# Patient Record
Sex: Female | Born: 1965
Health system: Southern US, Community
[De-identification: ages and names within clinical notes are randomized; demographics above are authoritative.]

## PROBLEM LIST (undated history)

## (undated) DIAGNOSIS — T7840XA Allergy, unspecified, initial encounter: Secondary | ICD-10-CM

## (undated) DIAGNOSIS — R7303 Prediabetes: Secondary | ICD-10-CM

## (undated) DIAGNOSIS — E559 Vitamin D deficiency, unspecified: Secondary | ICD-10-CM

## (undated) DIAGNOSIS — K219 Gastro-esophageal reflux disease without esophagitis: Secondary | ICD-10-CM

## (undated) HISTORY — DX: Allergy, unspecified, initial encounter: T78.40XA

## (undated) HISTORY — DX: Prediabetes: R73.03

## (undated) HISTORY — DX: Vitamin D deficiency, unspecified: E55.9

## (undated) HISTORY — DX: Gastro-esophageal reflux disease without esophagitis: K21.9

---

## 1998-08-04 ENCOUNTER — Other Ambulatory Visit: Admission: RE | Admit: 1998-08-04 | Discharge: 1998-08-04 | Payer: Self-pay | Admitting: Obstetrics and Gynecology

## 2000-03-17 ENCOUNTER — Emergency Department (HOSPITAL_COMMUNITY): Admission: EM | Admit: 2000-03-17 | Discharge: 2000-03-17 | Payer: Self-pay | Admitting: Emergency Medicine

## 2000-03-17 ENCOUNTER — Encounter: Payer: Self-pay | Admitting: Emergency Medicine

## 2000-05-11 ENCOUNTER — Encounter (INDEPENDENT_AMBULATORY_CARE_PROVIDER_SITE_OTHER): Payer: Self-pay

## 2000-05-11 ENCOUNTER — Ambulatory Visit (HOSPITAL_COMMUNITY): Admission: RE | Admit: 2000-05-11 | Discharge: 2000-05-11 | Payer: Self-pay | Admitting: Gynecology

## 2001-05-15 ENCOUNTER — Other Ambulatory Visit: Admission: RE | Admit: 2001-05-15 | Discharge: 2001-05-15 | Payer: Self-pay | Admitting: Gynecology

## 2001-10-20 ENCOUNTER — Other Ambulatory Visit: Admission: RE | Admit: 2001-10-20 | Discharge: 2001-10-20 | Payer: Self-pay | Admitting: Gynecology

## 2002-05-07 ENCOUNTER — Inpatient Hospital Stay (HOSPITAL_COMMUNITY): Admission: AD | Admit: 2002-05-07 | Discharge: 2002-05-09 | Payer: Self-pay | Admitting: Gynecology

## 2002-06-19 ENCOUNTER — Other Ambulatory Visit: Admission: RE | Admit: 2002-06-19 | Discharge: 2002-06-19 | Payer: Self-pay | Admitting: Gynecology

## 2004-10-22 ENCOUNTER — Other Ambulatory Visit: Admission: RE | Admit: 2004-10-22 | Discharge: 2004-10-22 | Payer: Self-pay | Admitting: Gynecology

## 2005-02-01 ENCOUNTER — Encounter: Admission: RE | Admit: 2005-02-01 | Discharge: 2005-02-01 | Payer: Self-pay | Admitting: Gynecology

## 2005-04-22 ENCOUNTER — Inpatient Hospital Stay (HOSPITAL_COMMUNITY): Admission: RE | Admit: 2005-04-22 | Discharge: 2005-04-25 | Payer: Self-pay | Admitting: Gynecology

## 2005-04-22 ENCOUNTER — Encounter (INDEPENDENT_AMBULATORY_CARE_PROVIDER_SITE_OTHER): Payer: Self-pay | Admitting: *Deleted

## 2005-06-03 ENCOUNTER — Other Ambulatory Visit: Admission: RE | Admit: 2005-06-03 | Discharge: 2005-06-03 | Payer: Self-pay | Admitting: Gynecology

## 2006-07-22 ENCOUNTER — Other Ambulatory Visit: Admission: RE | Admit: 2006-07-22 | Discharge: 2006-07-22 | Payer: Self-pay | Admitting: Gynecology

## 2007-07-26 ENCOUNTER — Other Ambulatory Visit: Admission: RE | Admit: 2007-07-26 | Discharge: 2007-07-26 | Payer: Self-pay | Admitting: Gynecology

## 2008-08-15 ENCOUNTER — Other Ambulatory Visit: Admission: RE | Admit: 2008-08-15 | Discharge: 2008-08-15 | Payer: Self-pay | Admitting: Gynecology

## 2008-08-15 ENCOUNTER — Ambulatory Visit: Payer: Self-pay | Admitting: Gynecology

## 2008-08-15 ENCOUNTER — Encounter: Payer: Self-pay | Admitting: Gynecology

## 2009-01-27 ENCOUNTER — Ambulatory Visit: Payer: Self-pay | Admitting: Internal Medicine

## 2009-10-08 ENCOUNTER — Ambulatory Visit: Payer: Self-pay | Admitting: Gynecology

## 2009-10-08 ENCOUNTER — Other Ambulatory Visit: Admission: RE | Admit: 2009-10-08 | Discharge: 2009-10-08 | Payer: Self-pay | Admitting: Gynecology

## 2009-10-13 LAB — HM PAP SMEAR: HM Pap smear: NORMAL

## 2010-07-03 NOTE — H&P (Signed)
Emily Lara, SEIVERT                         ACCOUNT NO.:  [id number]   MEDICAL RECORD NO.:  [id number]                   PATIENT TYPE:  INP   LOCATION:  9165                                 FACILITY:  [facility]   PHYSICIAN:  Juan H. Lily Peer, M.D.             DATE OF BIRTH:  01/10/1966   DATE OF ADMISSION:  05/07/2002  DATE OF DISCHARGE:                                HISTORY & PHYSICAL   CHIEF COMPLAINT:  1. Contraction.  2. Spontaneous rupture of membranes.   HISTORY:  The patient is a 45 year old gravida 2, para 0, AB-1 with an  estimated date of confinement of 05/23/02, currently 37-1/2 weeks estimated  gestational age presenting to [facility] complaining of rupture of  membranes since approximately 0930 Hr.  She showed up at the hospital later  on in the morning and was found to be contracting spontaneously every three  to five minutes apart.  There was evidence of gross rupture of membranes,  positive nitrazine, positive ferning, and patient's GBS status is negative  and she was admitted to labor and delivery.   PAST MEDICAL HISTORY:  Diet controlled gestational diabetic.  She had  declined genetic amniocentesis.  She did have a normal maternal serum alpha  fetoprotein test.  The third trimester she started having evidence of TIA.  Labs were drawn.  Her blood pressure had resolved, but her SGOT and SGPT  were slightly elevated.  The patient had repeat liver function tests which  were elevated but she had just gotten over a recent gastroenteritis.  Nevertheless a hepatitis panel had been obtained which was negative.   PAST MEDICAL HISTORY:  The patient denies any allergies.  She had a  spontaneous AB in '02.  Limited ankle swelling.  She has a negative past  medical history.  Her ultrasound and dates have coincided.   REVIEW OF SYSTEMS:  See Hollister form.   PHYSICAL EXAMINATION:  VITAL SIGNS: Stable, afebrile.  HEENT: Unremarkable.  NECK: Supple, trachea midline. No  carotid bruits.  No thyromegaly.  LUNGS: Clear to auscultation without rhonchi or wheezes.  HEART: Regular rate and rhythm, no murmurs or gallops.  BREAST EXAM: Not done.  ABDOMEN: Gravid uterus, vertex presentation, bilobe, ________ positive fetal  heart tones.  PELVIC EXAM: Cervix is one to two cm, 80% effaced, -3 station with gross  rupture of membranes.  LOWER EXTREMITIES:  Deep tendon reflexes 1+.  Negative clonus. Trace edema.   PRENATAL LABS:  Blood type O positive.  Negative antibody screen. VDRL was  nonreactive.  Rubella immune.  Hepatitis B surface antigen and HIV were  negative.  Alpha fetoprotein was normal.  Abnormal diabetes screen.  Pap  smear was normal.  GBS culture was negative.   ASSESSMENT:  45 year old gravida 2, para 0 at 37-1/2 weeks estimated  gestational age with gross rupture of membranes at approximately 0930 hour  in early labor.  Will augment  with Pitocin and at 2100 hour if she has not  delivered will start Pen-G protocol GBS prophylaxis.  Tracing reassuring on  admission.  Hopefully we can spare a vacuum delivery.   PLAN:  As per assessment above.                                               Juan H. Lily Peer, M.D.    JHF/MEDQ  D:  05/07/2002  T:  05/08/2002  Job:  [id number]

## 2010-07-03 NOTE — Discharge Summary (Signed)
NAMEJERILYNN, Emily Lara               ACCOUNT NO.:  [id number]   MEDICAL RECORD NO.:  [id number]          PATIENT TYPE:  INP   LOCATION:  9139                          [facility]ILITY[facility]  [facility]   PHYSICIAN:  Ivor Costa. Farrel Gobble, M.D. DATE OF BIRTH:  05-27-1965   DATE OF ADMISSION:  04/22/2005  DATE OF DISCHARGE:  04/25/2005                                 DISCHARGE SUMMARY   PRINCIPAL DIAGNOSIS:  Placenta previa.   ADDITIONAL DIAGNOSES:  1.  Gestational diabetes.  2.  term pregnancy.  3.  Transverse lie.   PRINCIPAL PROCEDURE:  Primary cesarean section.   HOSPITAL COURSE:  The patient was admitted in the morning of April 22, 2005,  and underwent an elective primary cesarean section, low transverse under  spinal anesthesia for the above indications with delivery of a viable female.  Birth weight was 5 pounds 8.  Apgars were 6 and 9, otherwise normal pelvis.  The patient was transferred to the postpartum floor in due fashion.  Her  postpartum course was unremarkable.  By postpartum day #3, the patient was  ready for discharge.  She was tolerating regular diet, ambulating without  difficulty, and bottle feeding her infant.  The patient was therefore  discharged home in stable condition with a prescription for Tylox 1 q.6 h  which we recommend she combine with over-the-counter Motrin 600 mg.  She was  instructed to follow up in our office in 6 weeks.  There was question with  regard to circumcision of the newborn which, we will, if the patient decides  to proceed with it, which will be done postdischarge status.  All questions  were addressed.  Her postoperative exam was unremarkable.  Her abdomen was  soft, nontender.  Incision was clean, dry, intact with Steri-Strips.  Her  extremities were negative.      Ivor Costa. Farrel Gobble, M.D.  Electronically Signed     THL/MEDQ  D:  04/25/2005  T:  04/26/2005  Job:  [id number]

## 2010-07-03 NOTE — H&P (Signed)
Emily Lara, Emily Lara               ACCOUNT NO.:  [id number]   MEDICAL RECORD NO.:  [id number]          PATIENT TYPE:  INP   LOCATION:  NA                            [facility]   PHYSICIAN:  Timothy P. Fontaine, M.D.DATE OF BIRTH:  1965-03-23   DATE OF ADMISSION:  DATE OF DISCHARGE:                                HISTORY & PHYSICAL   DATE OF ADMISSION:  April 22, 2005, [facility], 9 o'clock surgery.   CHIEF COMPLAINT:  Pregnancy at 37 weeks, placenta previa, gestational  diabetes, PUPPS.   HISTORY OF PRESENT ILLNESS:  A 45 year old G32, P52 female, [redacted] weeks  gestation, who has been followed for gestational diabetes with good glucose  control without medication, normal antepartum testing. The patient has  persistent posterior placenta previa, has been counseled as to the situation  and is admitted for cesarean section. The patient also has developed a rash  in the last 2 weeks consistent with PUPPS. For the remainder of her history,  see her Hollister.   EXAMINATION:  HEENT:  Normal.  LUNGS:  Clear.  CARDIAC:  Regular rate without rubs, murmurs or gallops.  ABDOMEN:  Gravid, vertex fetus, consistent with dates.  PELVIC:  Deferred.  SKIN:  With a papular diffuse truncal extremity rash consistent with PUPPS.   ASSESSMENT AND PLAN:  A 45 year old gravida 3, para 1 female, [redacted] weeks  gestation, persistent posterior placenta previa for cesarean section. The  patient is also followed for gestational diabetes, diet controlled, with  normal NST and AFI, weekly ultrasounds. The fetus has been known to be in  the 5th to 18th percentile as far as growth. She has had no episodes of  bleeding throughout this pregnancy. I reviewed the situation and the issues  of timing of her cesarean with or without amniocentesis for fetal lung  maturity. At [redacted] weeks gestation, I feel most appropriate to proceed with  direct delivery, recognizing possibilities for preterm issues such as  glucose  intolerance, heat stability, respiratory issues - all discussed with  the patient and she is comfortable with proceeding with the procedure. I  also discussed specifically the risks of a placenta previa for hemorrhage  and the realistic risk of transfusion during the procedure or immediately  following the procedure, and she understands and accepts these risks of  transfusion. She also understands a realistic risk of cesarean hysterectomy  if uncontrolled hemorrhage occurs either intraoperatively or  postoperatively, ultimately requiring hysterectomy and she understands and  accepts this possibility. The standard risks of cesarean section were  reviewed with her to include risk of infection both internal requiring  prolonged antibiotics, abscess formation requiring reoperation and abscess  drainage, as well as wound complications requiring opening and draining of  incisions and closure by secondary intention. The risk of inadvertent injury  to internal organs including bowel, bladder, ureters, vessels and nerves  necessitating major  exploratory reparative surgeries and future reparative surgeries including  ostomy formation was all discussed, understood, accepted. The patient's  questions were answered to her satisfaction and she is ready to proceed with  surgery.  Timothy P. Fontaine, M.D.  Electronically Signed     TPF/MEDQ  D:  04/16/2005  T:  04/16/2005  Job:  [id number]

## 2010-07-03 NOTE — Op Note (Signed)
[facility]  Patient:    Emily Lara, Emily Lara                      MRN: [id number] Proc. Date: 05/11/00 Adm. Date:  [id number] Attending:  Merrily Pew                           Operative Report  PREOPERATIVE DIAGNOSIS:       Missed abortion, approximately six weeks.  POSTOPERATIVE DIAGNOSIS:      Missed abortion, approximately six weeks.  OPERATION:                    Suction dilatation and curettage.  SURGEON:                      Timothy P. Fontaine, M.D.  ANESTHESIA:                   1% lidocaine paracervical block with IV sedation.  ESTIMATED BLOOD LOSS:         Minimal  INDICATIONS:                  None.  SPECIMEN:                     Products of conception.  FINDINGS:                     Uterus approximately 8-week size, soft.  Adnexa without gross masses.  Gross POC retrieved; #7 suction curet used.  DESCRIPTION OF PROCEDURE:     The patient was taken to the operating room and received IV sedation.  She was placed in the low dorsolithotomy position and was draped in the usual fashion.  Examination under anesthesia was performed, and subsequently the vagina was cleansed using Betadine solution.  A paracervical block using 1% lidocaine was placed approximately 10 cc total, and the anterior lip of the cervix was grasped with a single-tooth tenaculum for stabilization.  The cervix was then gently dilated to admit the #7 suction curet, and the suction curettage was then performed without difficulty.  The gross POC was retrieved.  A gentle sharp curettage was then performed to assure complete emptying without difficulty.  There was no evidence of perforation throughout the entire case.  The instruments were then removed. Adequate hemostasis was visualized.  The speculum was removed, the patient placed in the supine position and taken to the recovery room in good condition having tolerated the procedure well. DD:  05/11/00 TD:   05/12/00 Job: [id number]

## 2010-07-03 NOTE — H&P (Signed)
[facility] [place]  Patient:    Emily Lara, Emily Lara                        MRN: Adm. Date:  05/11/00 Attending:  Marcial Pacas P. Fontaine, M.D.                         History and Physical  She is being admitted for day surgery on May 11, 2000, at approximately 2 p.m.  CHIEF COMPLAINT:              Missed abortion.  HISTORY OF PRESENT ILLNESS:   A 45 year old, G1, P108 female, last menstrual period March 01, 2000, initially evaluated February 25 complaining of lower abdominal discomfort and back pain.  She was found at that time on ultrasound to have a less than 5-week size gestational sac, no fetal pole or yolk sac noted.  The patient underwent repeat ultrasound approximately twoweeks later, and she was found to have a gestational sac consistent with6 weeks and 4 days, although no fetal pole or viability was demonstrated.  The patient was followed, and repeat ultrasound nine days later showedno change in the gestational sac consistent with a missed AB.  The patient was counselled at that time for expectant versus D&C management, and the patient was reluctant to proceed with D&C management and elected for repeat ultrasound.  She again underwent ultrasound one week later and, again, findings were unchanged consistent with a missed AB, and she is admitted at this time for suction D&C. The patients blood type on outpatient evaluation was 0 positive.  PAST MEDICAL HISTORY:         Uncomplicated.  PAST SURGICAL HISTORY:        Uncomplicated.  ALLERGIES:                  None.  CURRENT MEDICATIONS:          Vitamins.  REVIEW OF SYSTEMS:            Noncontributory.  FAMILY HISTORY:            Noncontributory.  SOCIAL HISTORY:               Noncontributory.  PHYSICAL EXAMINATION:  VITAL SIGNS:        Afebrile.  Vital signs are stable.  GENERAL:    Exam per anesthesia intake.  PELVIC:                       External BUS, vagina normal.  Cervix  normal. Uterus retroverted, grossly normal in size.  Adnexa without masses or tenderness.  ASSESSMENT:              A 45 year old gravida 1, para 1 female with missed abortion by serial ultrasonographic evaluation followed expectantly with no bleeding for suction dilatation and curettage.  The patients blood type is O positive.  The risks, benefits, indications, and alternatives were reviewed with her, and the risks of bleeding, transfusion, infection, uterine perforation, damage to internal organs necessitating major exploratory repairative surgery all discussed, understood, and accepted.  The patients questions were answered to her satisfaction, and she is ready to proceed with surgery.DD:  05/10/00 TD:  05/10/00 Job: [id number]

## 2010-07-03 NOTE — Op Note (Signed)
NAMEWILHEMINA, Emily Lara               ACCOUNT NO.:  [id number]   MEDICAL RECORD NO.:  [id number]          PATIENT TYPE:  INP   LOCATION:  9199                          [facility]ILITY:  [facility]   PHYSICIAN:  Timothy P. Fontaine, M.D.DATE OF BIRTH:  Aug 07, 1965   DATE OF PROCEDURE:  04/22/2005  DATE OF DISCHARGE:                                 OPERATIVE REPORT   PREOPERATIVE DIAGNOSES:  Pregnancy at 37 weeks, placenta previa, gestational  diabetes, diet-controlled, PUPPS.   POSTOPERATIVE DIAGNOSES:  Pregnancy at 37 weeks, placenta previa,  gestational diabetes, diet-controlled, PUPPS.  With additional transverse  lie.   PROCEDURE:  Primary low transverse cervical cesarean section.   SURGEON:  Dr. Audie Box   ASSISTANT:  Dr. Lily Peer.   ANESTHETIC:  Regional.   SPECIMEN:  Placenta. Samples of cord blood.   ESTIMATED BLOOD LOSS:  Less than 500 mL.   COMPLICATIONS:  None.   FINDINGS:  At 73 normal female, Apgars 6 and 9, weight 5 pounds 8 ounces.  Pelvic anatomy noted to be normal. Infant found in the transverse  presentation head to the left. Pelvic anatomy was noted to be normal.   PROCEDURE:  The patient was taken to the operating room, underwent spinal  anesthesia and was placed in left tilt supine position, received abdominal  preparation with Betadine solution. Bladder emptied with indwelling Foley  catheterization placed in sterile technique and the patient was draped in  usual fashion. All per nursing personnel. After assuring adequate anesthesia  the abdomen sharply entered through a Pfannenstiel's incision achieving  adequate hemostasis at all levels. The bladder flap was sharply bluntly  developed without difficulty. Uterus sharply entered the lower uterine  segment and bluntly extended laterally. The bulging membranes were ruptured.  Infant found to be in the transverse presentation, converted to a footling  breech and underwent a breech extraction without difficulty. The  nares and  mouth were suctioned. The cord doubly clamped and cut. The infant was handed  to pediatrics in attendance. Samples of cord blood were obtained. Placenta  spontaneously extruded and noted to be intact and was sent to pathology,  noted to be a posterior previa. The uterus was exteriorized, endometrial  cavity explored with the sponge to remove all placental and membrane  fragments and the uterine incision was then closed in two layers using 0  Vicryl suture in a running interlocking stitch followed by imbricating  stitch. Several figure-of-eight interrupted sutures were placed to achieve  ultimate hemostasis along the uterine incision line. The patient received 1  gram Ancef antibiotic prophylaxis at this time. Uterus was returned to the  abdomen which was copiously irrigated. Adequate hemostasis visualized and  anterior fascia was reapproximated using 0 Vicryl suture in running stitch  starting at the angle, meeting in the middle. Subcutaneous tissues were  irrigated. Adequate hemostasis  achieved with electrocautery. Skin reapproximated using 4-0 Vicryl running  subcuticular stitch. Steri-Strips, Benzoin applied. Sterile dressing  applied. The patient was taken to recovery room in good condition having  tolerated procedure well.      Timothy P. Fontaine, M.D.  Electronically Signed  TPF/MEDQ  D:  04/22/2005  T:  04/22/2005  Job:  [id number]

## 2010-07-03 NOTE — Discharge Summary (Signed)
   [name], Emily Lara                         ACCOUNT NO.:  [id number]   MEDICAL RECORD NO.:  [id number]                   PATIENT TYPE:  INP   LOCATION:  9112                                 [facility]   PHYSICIAN:  Timothy P. Fontaine, M.D.           DATE OF BIRTH:  1965/06/20   DATE OF ADMISSION:  05/07/2002  DATE OF DISCHARGE:  05/09/2002                                 DISCHARGE SUMMARY   DISCHARGE DIAGNOSES:  1. Intrauterine pregnancy 37 weeks, delivered.  2. Gestational diabetes, diet controlled.  3. History of increased liver function tests.  4. Status post vacuum assisted vaginal delivery with midline episiotomy and     fourth degree laceration, repaired.   HISTORY:  This is a 45 year old female gravida 2, para 0, aborta 1 with an  EDC of May 23, 2002.  Prenatal course complicated by gestational diabetes  which was diet controlled.  She declined genetic amniocentesis.  She was  followed because she did have some increasing blood pressures.  PIH  laboratory was drawn.  Blood pressure returned back to normal but SGOT and  SGPT were slightly elevated.  The patient had repeat liver function tests  which again were elevated but she had just gotten over a recent  gastroenteritis.  Hepatitis panel returned, which was negative.   HOSPITAL COURSE:  On May 07, 2002 patient admitted at 37 weeks in labor.  Labor was augmented with Pitocin.  She was begun on IV _________ bilaterally  and on May 08, 2002 patient subsequently underwent a vacuum assisted  vaginal delivery secondary to poor maternal pushing coordination and  underwent delivery of a female, Apgars of 9 and 9, weight of 5 pounds 13  ounces.  There was a midline episiotomy with fourth degree laceration which  was repaired.  Postpartum initially had to be I&O catheterized, but  subsequently was able to void spontaneously.  On May 09, 2002 her SGOT was  slightly up at 40.  SGPT, however, returned back to normal.   The patient by  March _____ was voiding, stable condition without problems, and was felt  stable for discharge.   ACCESSORY CLINICAL FINDINGS:  Laboratories:  The patient is O+.  Rubella  immune.  On May 08, 2002 hemoglobin 11.1.  Glucose 133.  SGOT 40, SGPT 34.   DISPOSITION:  The patient was discharged to home.  Return to the office in  six weeks.  If any problem prior to that time to be seen in the office.  Given Tylox p.r.n. pain.  Was to have a follow-up LFTs at her follow-up  visit.     Susa Loffler, P.A.                    Timothy P. Audie Box, M.D.    TSG/MEDQ  D:  06/08/2002  T:  06/08/2002  Job:  [id number]

## 2011-01-06 ENCOUNTER — Encounter: Payer: Self-pay | Admitting: Gynecology

## 2011-01-06 ENCOUNTER — Ambulatory Visit (INDEPENDENT_AMBULATORY_CARE_PROVIDER_SITE_OTHER): Payer: BC Managed Care – PPO | Admitting: Gynecology

## 2011-01-06 VITALS — BP 120/70 | Ht 60.0 in | Wt 142.0 lb

## 2011-01-06 DIAGNOSIS — Z13220 Encounter for screening for lipoid disorders: Secondary | ICD-10-CM

## 2011-01-06 DIAGNOSIS — R635 Abnormal weight gain: Secondary | ICD-10-CM

## 2011-01-06 DIAGNOSIS — Z01419 Encounter for gynecological examination (general) (routine) without abnormal findings: Secondary | ICD-10-CM

## 2011-01-06 DIAGNOSIS — Z23 Encounter for immunization: Secondary | ICD-10-CM

## 2011-01-06 DIAGNOSIS — Z131 Encounter for screening for diabetes mellitus: Secondary | ICD-10-CM

## 2011-01-06 DIAGNOSIS — R82998 Other abnormal findings in urine: Secondary | ICD-10-CM

## 2011-01-06 NOTE — Progress Notes (Signed)
Emily Lara 1965-06-13 [id number]        45 y.o.  for annual exam.  Doing well other than complaining of some weight gain.  Past medical history,surgical history, medications, allergies, family history and social history were all reviewed and documented in the EPIC chart. ROS:  Was performed and pertinent positives and negatives are included in the history.  Exam: chaperone present Filed Vitals:   01/06/11 1540  BP: 120/70   General appearance  Normal Skin grossly normal Head/Neck normal with no cervical or supraclavicular adenopathy thyroid normal Lungs  clear Cardiac RR, without RMG Abdominal  soft, nontender, without masses, organomegaly or hernia Breasts  examined lying and sitting without masses, retractions, discharge or axillary adenopathy. Pelvic  Ext/BUS/vagina  normal   Cervix  normal    Uterus  retroverted, normal size, shape and contour, midline and mobile nontender   Adnexa  Without masses or tenderness    Anus and perineum  normal   Rectovaginal  normal sphincter tone without palpated masses or tenderness.    Assessment/Plan:  45 y.o. female for annual exam.    1. Weight gain. Patient states she's exercising and trying to eat appropriately but still is gaining weight. I will check a baseline TSH. I discussed guidelines as far as diet and exercise.  2. Birth control. Patient has stopped the birth control pills because she felt that they were contributing to her weight gain. She is using condoms and rhythm method and is comfortable with this. I reviewed the failure risk with this and the availability of Plan B. She does not want to do anything else. 3. Breast health. SBE monthly reviewed. She was due to have a repeat ultrasound in September and never followed up for this. She's going to call [facility] to follow up with them in reference to this and I stressed the importance of doing so. 4. Pap smear. Patient has no history of abnormal Pap smears before and has numerous  normal reports in her chart. Last normal was 2011. I discussed current screening guidelines and will plan on every 3 year Pap smear and she is comfortable with this. I did not do a Pap smear today. 5. Health maintenance. Baseline CBC urinalysis glucose lipid profile was ordered along with her TSH. Assuming she continues well from a gynecologic standpoint she will see me in a year sooner as needed.    Dara Lords MD, 4:01 PM 01/06/2011

## 2011-01-08 LAB — URINE CULTURE: Colony Count: 6000

## 2011-01-12 ENCOUNTER — Encounter: Payer: Self-pay | Admitting: Gynecology

## 2011-01-14 ENCOUNTER — Telehealth: Payer: Self-pay | Admitting: *Deleted

## 2011-01-14 NOTE — Telephone Encounter (Signed)
Lm for pt to call regarding [facility] note faxed to office for breast evaluation.

## 2011-01-28 ENCOUNTER — Encounter (INDEPENDENT_AMBULATORY_CARE_PROVIDER_SITE_OTHER): Payer: BC Managed Care – PPO | Admitting: Family Medicine

## 2011-01-28 DIAGNOSIS — E559 Vitamin D deficiency, unspecified: Secondary | ICD-10-CM

## 2011-01-28 DIAGNOSIS — Z13220 Encounter for screening for lipoid disorders: Secondary | ICD-10-CM

## 2011-01-28 DIAGNOSIS — Z0000 Encounter for general adult medical examination without abnormal findings: Secondary | ICD-10-CM

## 2011-06-09 ENCOUNTER — Encounter: Payer: Self-pay | Admitting: Gynecology

## 2011-06-10 ENCOUNTER — Encounter: Payer: Self-pay | Admitting: Gynecology

## 2012-01-12 ENCOUNTER — Encounter: Payer: BC Managed Care – PPO | Admitting: Gynecology

## 2012-06-05 ENCOUNTER — Encounter: Payer: Self-pay | Admitting: Gynecology

## 2012-06-08 LAB — HM MAMMOGRAPHY: HM Mammogram: NORMAL

## 2012-07-13 ENCOUNTER — Ambulatory Visit (INDEPENDENT_AMBULATORY_CARE_PROVIDER_SITE_OTHER): Payer: BC Managed Care – PPO | Admitting: Internal Medicine

## 2012-07-13 ENCOUNTER — Other Ambulatory Visit (INDEPENDENT_AMBULATORY_CARE_PROVIDER_SITE_OTHER): Payer: BC Managed Care – PPO

## 2012-07-13 ENCOUNTER — Encounter: Payer: Self-pay | Admitting: Internal Medicine

## 2012-07-13 VITALS — BP 120/78 | HR 70 | Temp 97.8°F | Resp 16 | Ht 60.0 in | Wt 136.8 lb

## 2012-07-13 DIAGNOSIS — Z0000 Encounter for general adult medical examination without abnormal findings: Secondary | ICD-10-CM

## 2012-07-13 LAB — CBC WITH DIFFERENTIAL/PLATELET
Eosinophils Relative: 4.2 % (ref 0.0–5.0)
HCT: 37.4 % (ref 36.0–46.0)
Hemoglobin: 12.8 g/dL (ref 12.0–15.0)
Lymphs Abs: 2.8 10*3/uL (ref 0.7–4.0)
Monocytes Relative: 5.2 % (ref 3.0–12.0)
Neutro Abs: 4 10*3/uL (ref 1.4–7.7)
Platelets: 263 10*3/uL (ref 150.0–400.0)
RBC: 4.14 Mil/uL (ref 3.87–5.11)
WBC: 7.6 10*3/uL (ref 4.5–10.5)

## 2012-07-13 LAB — URINALYSIS, ROUTINE W REFLEX MICROSCOPIC
Bilirubin Urine: NEGATIVE
Nitrite: NEGATIVE
pH: 6 (ref 5.0–8.0)

## 2012-07-13 LAB — COMPREHENSIVE METABOLIC PANEL
Albumin: 4 g/dL (ref 3.5–5.2)
BUN: 14 mg/dL (ref 6–23)
Calcium: 8.6 mg/dL (ref 8.4–10.5)
Chloride: 106 mEq/L (ref 96–112)
GFR: 142.09 mL/min (ref >60.00)
Glucose, Bld: 81 mg/dL (ref 70–99)
Potassium: 3.7 mEq/L (ref 3.5–5.1)

## 2012-07-13 LAB — LIPID PANEL
HDL: 53.7 mg/dL (ref >39.00)
Total CHOL/HDL Ratio: 3

## 2012-07-13 NOTE — Progress Notes (Signed)
  Subjective:    Patient ID: Emily Lara, female    DOB: 08/06/1965, 47 y.o.   MRN: [id number]  HPI  New to me for a physical, she feels well and offers no complaints.  Review of Systems  All other systems reviewed and are negative.       Objective:   Physical Exam  Vitals reviewed. Constitutional: She is oriented to person, place, and time. She appears well-developed and well-nourished. No distress.  HENT:  Head: Normocephalic and atraumatic.  Mouth/Throat: Oropharynx is clear and moist. No oropharyngeal exudate.  Eyes: Conjunctivae are normal. Right eye exhibits no discharge. Left eye exhibits no discharge. No scleral icterus.  Neck: Normal range of motion. Neck supple. No JVD present. No tracheal deviation present. No thyromegaly present.  Cardiovascular: Normal rate, regular rhythm, normal heart sounds and intact distal pulses.  Exam reveals no gallop and no friction rub.   No murmur heard. Pulmonary/Chest: Effort normal and breath sounds normal. No stridor. No respiratory distress. She has no wheezes. She has no rales. She exhibits no tenderness.  Abdominal: Soft. Bowel sounds are normal. She exhibits no distension and no mass. There is no tenderness. There is no rebound and no guarding.  Musculoskeletal: Normal range of motion. She exhibits no edema and no tenderness.  Lymphadenopathy:    She has no cervical adenopathy.  Neurological: She is oriented to person, place, and time.  Skin: Skin is warm and dry. No rash noted. She is not diaphoretic. No erythema. No pallor.  Psychiatric: She has a normal mood and affect. Her behavior is normal. Judgment and thought content normal.     No results found for this basename: WBC, HGB, HCT, PLT, GLUCOSE, CHOL, TRIG, HDL, LDLDIRECT, LDLCALC, ALT, AST, NA, K, CL, CREATININE, BUN, CO2, TSH, PSA, INR, GLUF, HGBA1C, MICROALBUR       Assessment & Plan:

## 2012-07-13 NOTE — Assessment & Plan Note (Signed)
Exam done Vaccines were reviewed Labs ordered Pt ed material was given 

## 2012-07-13 NOTE — Patient Instructions (Signed)
Preventive Care for Adults, Female A healthy lifestyle and preventive care can promote health and wellness. Preventive health guidelines for women include the following key practices.  A routine yearly physical is a good way to check with your caregiver about your health and preventive screening. It is a chance to share any concerns and updates on your health, and to receive a thorough exam.  Visit your dentist for a routine exam and preventive care every 6 months. Brush your teeth twice a day and floss once a day. Good oral hygiene prevents tooth decay and gum disease.  The frequency of eye exams is based on your age, health, family medical history, use of contact lenses, and other factors. Follow your caregiver's recommendations for frequency of eye exams.  Eat a healthy diet. Foods like vegetables, fruits, whole grains, low-fat dairy products, and lean protein foods contain the nutrients you need without too many calories. Decrease your intake of foods high in solid fats, added sugars, and salt. Eat the right amount of calories for you.Get information about a proper diet from your caregiver, if necessary.  Regular physical exercise is one of the most important things you can do for your health. Most adults should get at least 150 minutes of moderate-intensity exercise (any activity that increases your heart rate and causes you to sweat) each week. In addition, most adults need muscle-strengthening exercises on 2 or more days a week.  Maintain a healthy weight. The body mass index (BMI) is a screening tool to identify possible weight problems. It provides an estimate of body fat based on height and weight. Your caregiver can help determine your BMI, and can help you achieve or maintain a healthy weight.For adults 20 years and older:  A BMI below 18.5 is considered underweight.  A BMI of 18.5 to 24.9 is normal.  A BMI of 25 to 29.9 is considered overweight.  A BMI of 30 and above is  considered obese.  Maintain normal blood lipids and cholesterol levels by exercising and minimizing your intake of saturated fat. Eat a balanced diet with plenty of fruit and vegetables. Blood tests for lipids and cholesterol should begin at age 20 and be repeated every 5 years. If your lipid or cholesterol levels are high, you are over 50, or you are at high risk for heart disease, you may need your cholesterol levels checked more frequently.Ongoing high lipid and cholesterol levels should be treated with medicines if diet and exercise are not effective.  If you smoke, find out from your caregiver how to quit. If you do not use tobacco, do not start.  If you are pregnant, do not drink alcohol. If you are breastfeeding, be very cautious about drinking alcohol. If you are not pregnant and choose to drink alcohol, do not exceed 1 drink per day. One drink is considered to be 12 ounces (355 mL) of beer, 5 ounces (148 mL) of wine, or 1.5 ounces (44 mL) of liquor.  Avoid use of street drugs. Do not share needles with anyone. Ask for help if you need support or instructions about stopping the use of drugs.  High blood pressure causes heart disease and increases the risk of stroke. Your blood pressure should be checked at least every 1 to 2 years. Ongoing high blood pressure should be treated with medicines if weight loss and exercise are not effective.  If you are 55 to 45 years old, ask your caregiver if you should take aspirin to prevent strokes.  Diabetes   screening involves taking a blood sample to check your fasting blood sugar level. This should be done once every 3 years, after age 45, if you are within normal weight and without risk factors for diabetes. Testing should be considered at a younger age or be carried out more frequently if you are overweight and have at least 1 risk factor for diabetes.  Breast cancer screening is essential preventive care for women. You should practice "breast  self-awareness." This means understanding the normal appearance and feel of your breasts and may include breast self-examination. Any changes detected, no matter how small, should be reported to a caregiver. Women in their 20s and 30s should have a clinical breast exam (CBE) by a caregiver as part of a regular health exam every 1 to 3 years. After age 40, women should have a CBE every year. Starting at age 40, women should consider having a mammography (breast X-ray test) every year. Women who have a family history of breast cancer should talk to their caregiver about genetic screening. Women at a high risk of breast cancer should talk to their caregivers about having magnetic resonance imaging (MRI) and a mammography every year.  The Pap test is a screening test for cervical cancer. A Pap test can show cell changes on the cervix that might become cervical cancer if left untreated. A Pap test is a procedure in which cells are obtained and examined from the lower end of the uterus (cervix).  Women should have a Pap test starting at age 21.  Between ages 21 and 29, Pap tests should be repeated every 2 years.  Beginning at age 30, you should have a Pap test every 3 years as long as the past 3 Pap tests have been normal.  Some women have medical problems that increase the chance of getting cervical cancer. Talk to your caregiver about these problems. It is especially important to talk to your caregiver if a new problem develops soon after your last Pap test. In these cases, your caregiver may recommend more frequent screening and Pap tests.  The above recommendations are the same for women who have or have not gotten the vaccine for human papillomavirus (HPV).  If you had a hysterectomy for a problem that was not cancer or a condition that could lead to cancer, then you no longer need Pap tests. Even if you no longer need a Pap test, a regular exam is a good idea to make sure no other problems are  starting.  If you are between ages 65 and 70, and you have had normal Pap tests going back 10 years, you no longer need Pap tests. Even if you no longer need a Pap test, a regular exam is a good idea to make sure no other problems are starting.  If you have had past treatment for cervical cancer or a condition that could lead to cancer, you need Pap tests and screening for cancer for at least 20 years after your treatment.  If Pap tests have been discontinued, risk factors (such as a new sexual partner) need to be reassessed to determine if screening should be resumed.  The HPV test is an additional test that may be used for cervical cancer screening. The HPV test looks for the virus that can cause the cell changes on the cervix. The cells collected during the Pap test can be tested for HPV. The HPV test could be used to screen women aged 30 years and older, and should   be used in women of any age who have unclear Pap test results. After the age of 30, women should have HPV testing at the same frequency as a Pap test.  Colorectal cancer can be detected and often prevented. Most routine colorectal cancer screening begins at the age of 50 and continues through age 75. However, your caregiver may recommend screening at an earlier age if you have risk factors for colon cancer. On a yearly basis, your caregiver may provide home test kits to check for hidden blood in the stool. Use of a small camera at the end of a tube, to directly examine the colon (sigmoidoscopy or colonoscopy), can detect the earliest forms of colorectal cancer. Talk to your caregiver about this at age 50, when routine screening begins. Direct examination of the colon should be repeated every 5 to 10 years through age 75, unless early forms of pre-cancerous polyps or small growths are found.  Hepatitis C blood testing is recommended for all people born from 1945 through 1965 and any individual with known risks for hepatitis C.  Practice  safe sex. Use condoms and avoid high-risk sexual practices to reduce the spread of sexually transmitted infections (STIs). STIs include gonorrhea, chlamydia, syphilis, trichomonas, herpes, HPV, and human immunodeficiency virus (HIV). Herpes, HIV, and HPV are viral illnesses that have no cure. They can result in disability, cancer, and death. Sexually active women aged 25 and younger should be checked for chlamydia. Older women with new or multiple partners should also be tested for chlamydia. Testing for other STIs is recommended if you are sexually active and at increased risk.  Osteoporosis is a disease in which the bones lose minerals and strength with aging. This can result in serious bone fractures. The risk of osteoporosis can be identified using a bone density scan. Women ages 65 and over and women at risk for fractures or osteoporosis should discuss screening with their caregivers. Ask your caregiver whether you should take a calcium supplement or vitamin D to reduce the rate of osteoporosis.  Menopause can be associated with physical symptoms and risks. Hormone replacement therapy is available to decrease symptoms and risks. You should talk to your caregiver about whether hormone replacement therapy is right for you.  Use sunscreen with sun protection factor (SPF) of 30 or more. Apply sunscreen liberally and repeatedly throughout the day. You should seek shade when your shadow is shorter than you. Protect yourself by wearing long sleeves, pants, a wide-brimmed hat, and sunglasses year round, whenever you are outdoors.  Once a month, do a whole body skin exam, using a mirror to look at the skin on your back. Notify your caregiver of new moles, moles that have irregular borders, moles that are larger than a pencil eraser, or moles that have changed in shape or color.  Stay current with required immunizations.  Influenza. You need a dose every fall (or winter). The composition of the flu vaccine  changes each year, so being vaccinated once is not enough.  Pneumococcal polysaccharide. You need 1 to 2 doses if you smoke cigarettes or if you have certain chronic medical conditions. You need 1 dose at age 65 (or older) if you have never been vaccinated.  Tetanus, diphtheria, pertussis (Tdap, Td). Get 1 dose of Tdap vaccine if you are younger than age 65, are over 65 and have contact with an infant, are a healthcare worker, are pregnant, or simply want to be protected from whooping cough. After that, you need a Td   booster dose every 10 years. Consult your caregiver if you have not had at least 3 tetanus and diphtheria-containing shots sometime in your life or have a deep or dirty wound.  HPV. You need this vaccine if you are a woman age 26 or younger. The vaccine is given in 3 doses over 6 months.  Measles, mumps, rubella (MMR). You need at least 1 dose of MMR if you were born in 1957 or later. You may also need a second dose.  Meningococcal. If you are age 19 to 21 and a first-year college student living in a residence hall, or have one of several medical conditions, you need to get vaccinated against meningococcal disease. You may also need additional booster doses.  Zoster (shingles). If you are age 60 or older, you should get this vaccine.  Varicella (chickenpox). If you have never had chickenpox or you were vaccinated but received only 1 dose, talk to your caregiver to find out if you need this vaccine.  Hepatitis A. You need this vaccine if you have a specific risk factor for hepatitis A virus infection or you simply wish to be protected from this disease. The vaccine is usually given as 2 doses, 6 to 18 months apart.  Hepatitis B. You need this vaccine if you have a specific risk factor for hepatitis B virus infection or you simply wish to be protected from this disease. The vaccine is given in 3 doses, usually over 6 months. Preventive Services / Frequency Ages 19 to 39  Blood  pressure check.** / Every 1 to 2 years.  Lipid and cholesterol check.** / Every 5 years beginning at age 20.  Clinical breast exam.** / Every 3 years for women in their 20s and 30s.  Pap test.** / Every 2 years from ages 21 through 29. Every 3 years starting at age 30 through age 65 or 70 with a history of 3 consecutive normal Pap tests.  HPV screening.** / Every 3 years from ages 30 through ages 65 to 70 with a history of 3 consecutive normal Pap tests.  Hepatitis C blood test.** / For any individual with known risks for hepatitis C.  Skin self-exam. / Monthly.  Influenza immunization.** / Every year.  Pneumococcal polysaccharide immunization.** / 1 to 2 doses if you smoke cigarettes or if you have certain chronic medical conditions.  Tetanus, diphtheria, pertussis (Tdap, Td) immunization. / A one-time dose of Tdap vaccine. After that, you need a Td booster dose every 10 years.  HPV immunization. / 3 doses over 6 months, if you are 26 and younger.  Measles, mumps, rubella (MMR) immunization. / You need at least 1 dose of MMR if you were born in 1957 or later. You may also need a second dose.  Meningococcal immunization. / 1 dose if you are age 19 to 21 and a first-year college student living in a residence hall, or have one of several medical conditions, you need to get vaccinated against meningococcal disease. You may also need additional booster doses.  Varicella immunization.** / Consult your caregiver.  Hepatitis A immunization.** / Consult your caregiver. 2 doses, 6 to 18 months apart.  Hepatitis B immunization.** / Consult your caregiver. 3 doses usually over 6 months. Ages 40 to 64  Blood pressure check.** / Every 1 to 2 years.  Lipid and cholesterol check.** / Every 5 years beginning at age 20.  Clinical breast exam.** / Every year after age 40.  Mammogram.** / Every year beginning at age 40   and continuing for as long as you are in good health. Consult with your  caregiver.  Pap test.** / Every 3 years starting at age 30 through age 65 or 70 with a history of 3 consecutive normal Pap tests.  HPV screening.** / Every 3 years from ages 30 through ages 65 to 70 with a history of 3 consecutive normal Pap tests.  Fecal occult blood test (FOBT) of stool. / Every year beginning at age 50 and continuing until age 75. You may not need to do this test if you get a colonoscopy every 10 years.  Flexible sigmoidoscopy or colonoscopy.** / Every 5 years for a flexible sigmoidoscopy or every 10 years for a colonoscopy beginning at age 50 and continuing until age 75.  Hepatitis C blood test.** / For all people born from 1945 through 1965 and any individual with known risks for hepatitis C.  Skin self-exam. / Monthly.  Influenza immunization.** / Every year.  Pneumococcal polysaccharide immunization.** / 1 to 2 doses if you smoke cigarettes or if you have certain chronic medical conditions.  Tetanus, diphtheria, pertussis (Tdap, Td) immunization.** / A one-time dose of Tdap vaccine. After that, you need a Td booster dose every 10 years.  Measles, mumps, rubella (MMR) immunization. / You need at least 1 dose of MMR if you were born in 1957 or later. You may also need a second dose.  Varicella immunization.** / Consult your caregiver.  Meningococcal immunization.** / Consult your caregiver.  Hepatitis A immunization.** / Consult your caregiver. 2 doses, 6 to 18 months apart.  Hepatitis B immunization.** / Consult your caregiver. 3 doses, usually over 6 months. Ages 65 and over  Blood pressure check.** / Every 1 to 2 years.  Lipid and cholesterol check.** / Every 5 years beginning at age 20.  Clinical breast exam.** / Every year after age 40.  Mammogram.** / Every year beginning at age 40 and continuing for as long as you are in good health. Consult with your caregiver.  Pap test.** / Every 3 years starting at age 30 through age 65 or 70 with a 3  consecutive normal Pap tests. Testing can be stopped between 65 and 70 with 3 consecutive normal Pap tests and no abnormal Pap or HPV tests in the past 10 years.  HPV screening.** / Every 3 years from ages 30 through ages 65 or 70 with a history of 3 consecutive normal Pap tests. Testing can be stopped between 65 and 70 with 3 consecutive normal Pap tests and no abnormal Pap or HPV tests in the past 10 years.  Fecal occult blood test (FOBT) of stool. / Every year beginning at age 50 and continuing until age 75. You may not need to do this test if you get a colonoscopy every 10 years.  Flexible sigmoidoscopy or colonoscopy.** / Every 5 years for a flexible sigmoidoscopy or every 10 years for a colonoscopy beginning at age 50 and continuing until age 75.  Hepatitis C blood test.** / For all people born from 1945 through 1965 and any individual with known risks for hepatitis C.  Osteoporosis screening.** / A one-time screening for women ages 65 and over and women at risk for fractures or osteoporosis.  Skin self-exam. / Monthly.  Influenza immunization.** / Every year.  Pneumococcal polysaccharide immunization.** / 1 dose at age 65 (or older) if you have never been vaccinated.  Tetanus, diphtheria, pertussis (Tdap, Td) immunization. / A one-time dose of Tdap vaccine if you are over   65 and have contact with an infant, are a healthcare worker, or simply want to be protected from whooping cough. After that, you need a Td booster dose every 10 years.  Varicella immunization.** / Consult your caregiver.  Meningococcal immunization.** / Consult your caregiver.  Hepatitis A immunization.** / Consult your caregiver. 2 doses, 6 to 18 months apart.  Hepatitis B immunization.** / Check with your caregiver. 3 doses, usually over 6 months. ** Family history and personal history of risk and conditions may change your caregiver's recommendations. Document Released: 03/30/2001 Document Revised: 04/26/2011  Document Reviewed: 06/29/2010 [employer] Patient Information 2014 [employer].  

## 2013-01-29 ENCOUNTER — Encounter: Payer: Self-pay | Admitting: Gynecology

## 2013-01-29 ENCOUNTER — Other Ambulatory Visit (HOSPITAL_COMMUNITY)
Admission: RE | Admit: 2013-01-29 | Discharge: 2013-01-29 | Disposition: A | Discharge status: Home / Self Care | Payer: BC Managed Care – PPO | Source: Ambulatory Visit | Attending: Gynecology | Admitting: Gynecology

## 2013-01-29 ENCOUNTER — Ambulatory Visit (INDEPENDENT_AMBULATORY_CARE_PROVIDER_SITE_OTHER): Payer: BC Managed Care – PPO | Admitting: Gynecology

## 2013-01-29 VITALS — BP 114/70 | Ht 60.0 in | Wt 137.6 lb

## 2013-01-29 DIAGNOSIS — Z01419 Encounter for gynecological examination (general) (routine) without abnormal findings: Secondary | ICD-10-CM | POA: Insufficient documentation

## 2013-01-29 DIAGNOSIS — Z1151 Encounter for screening for human papillomavirus (HPV): Secondary | ICD-10-CM | POA: Insufficient documentation

## 2013-01-29 DIAGNOSIS — Z309 Encounter for contraceptive management, unspecified: Secondary | ICD-10-CM

## 2013-01-29 DIAGNOSIS — N951 Menopausal and female climacteric states: Secondary | ICD-10-CM

## 2013-01-29 NOTE — Progress Notes (Signed)
Emily Lara [birth year] [id number]        47 y.o.  [id number] for annual exam.  Several issues below.  Past medical history,surgical history, problem list, medications, allergies, family history and social history were all reviewed and documented in the EPIC chart.  ROS:  Performed and pertinent positives and negatives are included in the history, assessment and plan .  Exam: Emily Lara assistant Filed Vitals:   01/29/13 1440  BP: 114/70  Height: 5' (1.524 m)  Weight: 137 lb 9.6 oz (62.415 kg)   General appearance  Normal Skin grossly normal Head/Neck normal with no cervical or supraclavicular adenopathy thyroid normal Lungs  clear Cardiac RR, without RMG Abdominal  soft, nontender, without masses, organomegaly or hernia Breasts  examined lying and sitting without masses, retractions, discharge or axillary adenopathy. Pelvic  Ext/BUS/vagina  Normal   Cervix  Normal with Pap/HPV done  Uterus  retroverted, normal size, shape and contour, midline and mobile nontender   Adnexa  Without masses or tenderness    Anus and perineum  Normal   Rectovaginal  Normal sphincter tone without palpated masses or tenderness.    Assessment/Plan:  47 y.o. [name] female for annual exam, regular menses, condom birth control.   1. Contraceptive management. I again reviewed contraceptive options. Does not want hormonal containing products such as pills. She feels it's related to her weight gain. Literature for Mirena IUD was provided. I reviewed mechanism of action and it does contain progesterone. She's happy with condoms for now. I reviewed the failure risk and again the availability of plan B. Patient's to followup for Mirena IUD if she chooses otherwise annually. 2. Hot flushes. Patient noted some hot flashes primarily at night. Having regular menses without other symptoms. We'll check baseline TSH FSH. If labs normal and symptoms continue she'll followup with her primary for further evaluation. 3. Pap  smear 2011. Pap/HPV done today. No history of abnormal Pap smears previously. 4. Mammography 05/2012. Continue with annual mammography. SBE monthly reviewed. 5. Health maintenance. No routine blood work done as she reports it is done through her primary physician's office. Followup if she decides for Mirena IUD otherwise annually   Note: This document was prepared with digital dictation and possible smart phrase technology. Any transcriptional errors that result from this process are unintentional.   Emily Lords MD, 2:59 PM 01/29/2013

## 2013-01-29 NOTE — Addendum Note (Signed)
Addended by: Richardson Chiquito on: 01/29/2013 03:42 PM   Modules accepted: Orders

## 2013-01-29 NOTE — Patient Instructions (Signed)
Followup for the hormone results. Followup for the Mirena IUD if you choose to arrange. Followup in one year for annual exam.

## 2013-01-30 ENCOUNTER — Encounter: Payer: Self-pay | Admitting: Gynecology

## 2013-01-30 LAB — TSH: TSH: 1.781 u[IU]/mL (ref 0.350–4.500)

## 2013-01-30 LAB — URINALYSIS W MICROSCOPIC + REFLEX CULTURE
Bilirubin Urine: NEGATIVE
Crystals: NONE SEEN
Specific Gravity, Urine: 1.015 (ref 1.005–1.030)
Squamous Epithelial / LPF: NONE SEEN
Urobilinogen, UA: 0.2 mg/dL (ref 0.0–1.0)

## 2013-01-31 ENCOUNTER — Other Ambulatory Visit: Payer: Self-pay | Admitting: Gynecology

## 2013-01-31 MED ORDER — SULFAMETHOXAZOLE-TMP DS 800-160 MG PO TABS: 1.0000 | ORAL_TABLET | Freq: Two times a day (BID) | ORAL | Status: DC

## 2013-06-02 ENCOUNTER — Ambulatory Visit (INDEPENDENT_AMBULATORY_CARE_PROVIDER_SITE_OTHER): Payer: BC Managed Care – PPO | Admitting: Family Medicine

## 2013-06-02 VITALS — BP 100/62 | HR 81 | Temp 97.7°F | Resp 18 | Ht 60.5 in | Wt 143.0 lb

## 2013-06-02 DIAGNOSIS — J209 Acute bronchitis, unspecified: Secondary | ICD-10-CM

## 2013-06-02 DIAGNOSIS — R05 Cough: Secondary | ICD-10-CM

## 2013-06-02 DIAGNOSIS — R059 Cough, unspecified: Secondary | ICD-10-CM

## 2013-06-02 DIAGNOSIS — J029 Acute pharyngitis, unspecified: Secondary | ICD-10-CM

## 2013-06-02 MED ORDER — AZITHROMYCIN 250 MG PO TABS: ORAL_TABLET | ORAL | Status: DC

## 2013-06-02 MED ORDER — PREDNISONE 20 MG PO TABS: ORAL_TABLET | ORAL | Status: DC

## 2013-06-02 NOTE — Patient Instructions (Signed)

## 2013-06-02 NOTE — Progress Notes (Signed)
° °  Subjective:   This chart was scribed for Robyn Haber, MD  by Forrestine Him, [facility] Scribe. This patient was seen in room 4 and the patient's care was started 10:01 AM.    Patient ID: Emily Lara, female    DOB: 1965-05-07, 48 y.o.   MRN: [id number]  Chief Complaint  Patient presents with   Cough    x3 days otc's no not helping   Sore Throat     HPI  HPI Comments: Emily Lara is a 48 y.o. female who presents to [facility] complaining of constant, moderate sore throat x 3 days that is progressively worsening. She also reports associated cough and wheezing. She has tried multiple OTC medications with no noticeable improvement for her symptoms. At this time she denies any congestion or fever. No pertinent medical history. No other concerns this visit.  Pt currently works at [employer]  Past Medical History  Diagnosis Date   Allergy     Review of Systems  Constitutional: Negative for fever and chills.  HENT: Positive for sore throat. Negative for congestion.   Eyes: Negative for redness.  Respiratory: Positive for cough and wheezing.   Skin: Negative for rash.  Psychiatric/Behavioral: Negative for confusion.       Objective:   Physical Exam  Nursing note and vitals reviewed. Constitutional: She is oriented to person, place, and time. She appears well-developed and well-nourished.  HENT:  Head: Normocephalic and atraumatic.  Eyes: EOM are normal.  Neck: Normal range of motion.  Cardiovascular: Normal rate, regular rhythm and normal heart sounds.   Pulmonary/Chest: Effort normal. She has wheezes.  Musculoskeletal: Normal range of motion.  Neurological: She is alert and oriented to person, place, and time.  Skin: Skin is warm and dry.  Psychiatric: She has a normal mood and affect. Her behavior is normal.    Assessment & Plan:  I personally performed the services described in this documentation, which was scribed in  my presence. The recorded information has been reviewed and is accurate. Cough - Plan: azithromycin (ZITHROMAX Z-PAK) 250 MG tablet, predniSONE (DELTASONE) 20 MG tablet  Signed, Robyn Haber, MD

## 2013-06-06 ENCOUNTER — Encounter: Payer: Self-pay | Admitting: Gynecology

## 2013-06-07 ENCOUNTER — Encounter: Payer: Self-pay | Admitting: Gynecology

## 2014-04-29 ENCOUNTER — Ambulatory Visit (INDEPENDENT_AMBULATORY_CARE_PROVIDER_SITE_OTHER): Payer: BLUE CROSS/BLUE SHIELD | Admitting: Family Medicine

## 2014-04-29 ENCOUNTER — Encounter: Payer: Self-pay | Admitting: Family Medicine

## 2014-04-29 VITALS — BP 118/76 | HR 70 | Temp 98.0°F | Resp 16 | Ht 59.5 in | Wt 139.2 lb

## 2014-04-29 DIAGNOSIS — Z131 Encounter for screening for diabetes mellitus: Secondary | ICD-10-CM

## 2014-04-29 DIAGNOSIS — J302 Other seasonal allergic rhinitis: Secondary | ICD-10-CM

## 2014-04-29 DIAGNOSIS — Z0000 Encounter for general adult medical examination without abnormal findings: Secondary | ICD-10-CM | POA: Diagnosis not present

## 2014-04-29 DIAGNOSIS — Z1389 Encounter for screening for other disorder: Secondary | ICD-10-CM | POA: Diagnosis not present

## 2014-04-29 DIAGNOSIS — Z23 Encounter for immunization: Secondary | ICD-10-CM

## 2014-04-29 DIAGNOSIS — Z13220 Encounter for screening for lipoid disorders: Secondary | ICD-10-CM

## 2014-04-29 DIAGNOSIS — Z130 Encounter for screening for diseases of the blood and blood-forming organs and certain disorders involving the immune mechanism: Secondary | ICD-10-CM

## 2014-04-29 DIAGNOSIS — E559 Vitamin D deficiency, unspecified: Secondary | ICD-10-CM

## 2014-04-29 LAB — CBC
HEMATOCRIT: 40.4 % (ref 36.0–46.0)
HEMOGLOBIN: 13.2 g/dL (ref 12.0–15.0)
MCH: 30.1 pg (ref 26.0–34.0)
MCHC: 32.7 g/dL (ref 30.0–36.0)
MCV: 92.2 fL (ref 78.0–100.0)
MPV: 9.9 fL (ref 8.6–12.4)
Platelets: 281 10*3/uL (ref 150–400)
RBC: 4.38 MIL/uL (ref 3.87–5.11)
RDW: 13.6 % (ref 11.5–15.5)
WBC: 8.6 10*3/uL (ref 4.0–10.5)

## 2014-04-29 LAB — COMPREHENSIVE METABOLIC PANEL
ALT: 26 U/L (ref 0–35)
AST: 20 U/L (ref 0–37)
Albumin: 4.4 g/dL (ref 3.5–5.2)
Alkaline Phosphatase: 47 U/L (ref 39–117)
BILIRUBIN TOTAL: 1 mg/dL (ref 0.2–1.2)
BUN: 14 mg/dL (ref 6–23)
CALCIUM: 9.1 mg/dL (ref 8.4–10.5)
CO2: 24 meq/L (ref 19–32)
Chloride: 104 mEq/L (ref 96–112)
Creat: 0.46 mg/dL — ABNORMAL LOW (ref 0.50–1.10)
GLUCOSE: 71 mg/dL (ref 70–99)
POTASSIUM: 3.7 meq/L (ref 3.5–5.3)
SODIUM: 139 meq/L (ref 135–145)
Total Protein: 7.3 g/dL (ref 6.0–8.3)

## 2014-04-29 LAB — LIPID PANEL
CHOL/HDL RATIO: 2.7 ratio
Cholesterol: 162 mg/dL (ref 0–200)
HDL: 60 mg/dL (ref >=46)
LDL Cholesterol: 90 mg/dL (ref 0–99)
Triglycerides: 61 mg/dL (ref <150)
VLDL: 12 mg/dL (ref 0–40)

## 2014-04-29 LAB — HEMOGLOBIN A1C
Hgb A1c MFr Bld: 5.8 % — ABNORMAL HIGH (ref <5.7)
Mean Plasma Glucose: 120 mg/dL — ABNORMAL HIGH (ref <117)

## 2014-04-29 MED ORDER — FLUTICASONE PROPIONATE 50 MCG/ACT NA SUSP: 2.0000 | Freq: Every day | NASAL | Status: DC

## 2014-04-29 NOTE — Progress Notes (Signed)
Subjective:    Patient ID: Emily Lara, female    DOB: Oct 21, 1965, 49 y.o.   MRN: [id number]  HPI This is a very pleasant 49 yo female who presents today for CPE. Last CPE- 5/14 PAP- 01/29/13- normal, sees gyn  Mammo- 4/15 Tdap- unknown Flu- declines Dental- every 6 months Eye- annual-wears glasses Exercise- treadmill 15-20 minutes most days  She had gestational diabetes and would like to be screened for diabetes. She has lost 4 pounds since last year- she has been trying to make healthier food choices.   She takes zyrtec for allergies but does not feel like rhinorrhea is very well controlled. She has been having clear drainage and congestion recently.   Current Outpatient Prescriptions on File Prior to Visit  Medication Sig Dispense Refill  . Cetirizine HCl (ZYRTEC ALLERGY PO) Take by mouth.       No current facility-administered medications on file prior to visit.    Past Medical History  Diagnosis Date  . Allergy    Past Surgical History  Procedure Laterality Date  . Cesarean section  2007   Family History  Problem Relation Age of Onset  . Hyperlipidemia Father   . Alcohol abuse Neg Hx   . Cancer Neg Hx   . COPD Neg Hx   . Depression Neg Hx   . Diabetes Neg Hx   . Drug abuse Neg Hx   . Early death Neg Hx   . Hearing loss Neg Hx   . Heart disease Neg Hx   . Hypertension Neg Hx   . Kidney disease Neg Hx   . Stroke Neg Hx    History  Substance Use Topics  . Smoking status: Never Smoker   . Smokeless tobacco: Never Used  . Alcohol Use: No     Review of Systems  Constitutional: Negative.   HENT: Negative.   Eyes: Negative.   Respiratory: Negative.   Cardiovascular: Negative.   Gastrointestinal: Negative.   Endocrine: Negative.   Genitourinary: Negative.   Musculoskeletal: Negative.   Skin: Negative.   Allergic/Immunologic: Positive for environmental allergies.  Neurological: Negative.   Hematological: Negative.   Psychiatric/Behavioral:  Negative.        Objective:   Physical Exam  Constitutional: She is oriented to person, place, and time. She appears well-developed and well-nourished.  HENT:  Head: Normocephalic and atraumatic.  Right Ear: Tympanic membrane, external ear and ear canal normal.  Left Ear: Tympanic membrane, external ear and ear canal normal.  Nose: Nose normal.  Mouth/Throat: Oropharynx is clear and moist. No oropharyngeal exudate.  Eyes: Conjunctivae are normal. Pupils are equal, round, and reactive to light.  Neck: Normal range of motion. Neck supple. No thyromegaly present.  Cardiovascular: Normal rate, regular rhythm, normal heart sounds and intact distal pulses.   Pulmonary/Chest: Effort normal and breath sounds normal.  Abdominal: Soft. Bowel sounds are normal. She exhibits no distension and no mass. There is no hepatosplenomegaly. There is no tenderness. There is no rebound and no guarding.  Musculoskeletal: Normal range of motion.  Lymphadenopathy:    She has no cervical adenopathy.  Neurological: She is alert and oriented to person, place, and time. She has normal reflexes.  Skin: Skin is warm and dry.  Psychiatric: She has a normal mood and affect. Her behavior is normal. Judgment and thought content normal.  Vitals reviewed.  BP 118/76 mmHg  Pulse 70  Temp(Src) 98 F (36.7 C) (Oral)  Resp 16  Ht 4' 11.5" (1.511 m)  Wt 139 lb 3.2 oz (63.141 kg)  BMI 27.66 kg/m2  SpO2 97%  LMP 03/20/2014    Assessment & Plan:  1. Annual physical exam -encouraged continued regular exercise and healthy food choices. Encouraged her to add stretching/yoga/meditation.  2. Screening for nephropathy - Comprehensive metabolic panel  3. Screening for lipid disorders - Lipid panel  4. Screening for hematuria or proteinuria - POCT urinalysis dipstick  5. Screening for diabetes mellitus - Comprehensive metabolic panel - Hemoglobin A1c  6. Vitamin D deficiency - Vit D  25 hydroxy (rtn osteoporosis  monitoring)  7. Other seasonal allergic rhinitis - fluticasone (FLONASE) 50 MCG/ACT nasal spray; Place 2 sprays into both nostrils daily.  Dispense: 16 g; Refill: 6  8. Screening for deficiency anemia - CBC  9. Need for Tdap vaccination -Tdap administered   Elby Beck, FNP-BC  [facility] [place] [facility]  04/29/2014 9:01 PM

## 2014-04-29 NOTE — Patient Instructions (Signed)
Keep exercising- good job Add some yoga or stretching exercises  Exercise to Stay Healthy Exercise helps you become and stay healthy. EXERCISE IDEAS AND TIPS Choose exercises that:  You enjoy.  Fit into your day. You do not need to exercise really hard to be healthy. You can do exercises at a slow or medium level and stay healthy. You can:  Stretch before and after working out.  Try yoga, Pilates, or tai chi.  Lift weights.  Walk fast, swim, jog, run, climb stairs, bicycle, dance, or rollerskate.  Take aerobic classes. Exercises that burn about 150 calories:  Running 1  miles in 15 minutes.  Playing volleyball for 45 to 60 minutes.  Washing and waxing a car for 45 to 60 minutes.  Playing touch football for 45 minutes.  Walking 1  miles in 35 minutes.  Pushing a stroller 1  miles in 30 minutes.  Playing basketball for 30 minutes.  Raking leaves for 30 minutes.  Bicycling 5 miles in 30 minutes.  Walking 2 miles in 30 minutes.  Dancing for 30 minutes.  Shoveling snow for 15 minutes.  Swimming laps for 20 minutes.  Walking up stairs for 15 minutes.  Bicycling 4 miles in 15 minutes.  Gardening for 30 to 45 minutes.  Jumping rope for 15 minutes.  Washing windows or floors for 45 to 60 minutes. Document Released: 03/06/2010 Document Revised: 04/26/2011 Document Reviewed: 03/06/2010 [facility] Patient Information 2015 [place], [place]. This information is not intended to replace advice given to you by your health care provider. Make sure you discuss any questions you have with your health care provider.

## 2014-04-30 ENCOUNTER — Encounter: Payer: Self-pay | Admitting: Family Medicine

## 2014-04-30 ENCOUNTER — Other Ambulatory Visit: Payer: Self-pay | Admitting: Family Medicine

## 2014-04-30 DIAGNOSIS — E559 Vitamin D deficiency, unspecified: Secondary | ICD-10-CM

## 2014-04-30 LAB — VITAMIN D 25 HYDROXY (VIT D DEFICIENCY, FRACTURES): Vit D, 25-Hydroxy: 14 ng/mL — ABNORMAL LOW (ref 30–100)

## 2014-04-30 MED ORDER — VITAMIN D (ERGOCALCIFEROL) 1.25 MG (50000 UNIT) PO CAPS: ORAL_CAPSULE | ORAL | Status: DC

## 2014-04-30 NOTE — Addendum Note (Signed)
Addended by: Yvette Rack on: 04/30/2014 08:22 AM   Modules accepted: Orders

## 2014-05-03 ENCOUNTER — Telehealth: Payer: Self-pay

## 2014-05-03 NOTE — Telephone Encounter (Signed)
Pt was called in a prescription for Vitamin D to [facility].  She says every time she goes there they give her a hard time and tell her they have no prescriptions for her.  She does not want to have any prescriptions sent electronically anymore and only wants a paper script.  Can we please write this prescription out for her to pick up?  [phone]

## 2014-05-03 NOTE — Telephone Encounter (Signed)
Debbie- Can you write a paper rx for pt for Vitamin D?

## 2014-05-04 NOTE — Telephone Encounter (Signed)
Please let her know I will print her prescription sometime tomorrow (Monday, 3/20). It may not be until later in the day, she should plan to pick up on Tuesday or later.

## 2014-05-06 ENCOUNTER — Other Ambulatory Visit: Payer: Self-pay | Admitting: Family Medicine

## 2014-05-06 DIAGNOSIS — E559 Vitamin D deficiency, unspecified: Secondary | ICD-10-CM

## 2014-05-06 MED ORDER — VITAMIN D (ERGOCALCIFEROL) 1.25 MG (50000 UNIT) PO CAPS: ORAL_CAPSULE | ORAL | Status: DC

## 2014-05-06 NOTE — Telephone Encounter (Signed)
Spoke to pt, she is aware 

## 2014-05-09 ENCOUNTER — Ambulatory Visit (INDEPENDENT_AMBULATORY_CARE_PROVIDER_SITE_OTHER): Payer: BLUE CROSS/BLUE SHIELD | Admitting: Family Medicine

## 2014-05-09 ENCOUNTER — Encounter: Payer: Self-pay | Admitting: Family Medicine

## 2014-05-09 VITALS — BP 118/80 | HR 78 | Temp 98.4°F | Resp 16 | Ht 59.5 in | Wt 140.8 lb

## 2014-05-09 DIAGNOSIS — J209 Acute bronchitis, unspecified: Secondary | ICD-10-CM | POA: Diagnosis not present

## 2014-05-09 DIAGNOSIS — J301 Allergic rhinitis due to pollen: Secondary | ICD-10-CM

## 2014-05-09 MED ORDER — HYDROCODONE-HOMATROPINE 5-1.5 MG/5ML PO SYRP: 5.0000 mL | ORAL_SOLUTION | Freq: Three times a day (TID) | ORAL | Status: DC | PRN

## 2014-05-09 MED ORDER — PREDNISONE 10 MG PO TABS: ORAL_TABLET | ORAL | Status: DC

## 2014-05-09 NOTE — Patient Instructions (Signed)
Acute Bronchitis Bronchitis is when the airways that extend from the windpipe into the lungs get red, puffy, and painful (inflamed). Bronchitis often causes thick spit (mucus) to develop. This leads to a cough. A cough is the most common symptom of bronchitis. In acute bronchitis, the condition usually begins suddenly and goes away over time (usually in 2 weeks). Smoking, allergies, and asthma can make bronchitis worse. Repeated episodes of bronchitis may cause more lung problems. HOME CARE  Rest.  Drink enough fluids to keep your pee (urine) clear or pale yellow (unless you need to limit fluids as told by your doctor).  Only take over-the-counter or prescription medicines as told by your doctor.  Avoid smoking and secondhand smoke. These can make bronchitis worse. If you are a smoker, think about using nicotine gum or skin patches. Quitting smoking will help your lungs heal faster.  Reduce the chance of getting bronchitis again by:  Washing your hands often.  Avoiding people with cold symptoms.  Trying not to touch your hands to your mouth, nose, or eyes.  Follow up with your doctor as told. GET HELP IF: Your symptoms do not improve after 1 week of treatment. Symptoms include:  Cough.  Fever.  Coughing up thick spit.  Body aches.  Chest congestion.  Chills.  Shortness of breath.  Sore throat. GET HELP RIGHT AWAY IF:   You have an increased fever.  You have chills.  You have severe shortness of breath.  You have bloody thick spit (sputum).  You throw up (vomit) often.  You lose too much body fluid (dehydration).  You have a severe headache.  You faint. MAKE SURE YOU:   Understand these instructions.  Will watch your condition.  Will get help right away if you are not doing well or get worse. Document Released: 07/21/2007 Document Revised: 10/04/2012 Document Reviewed: 07/25/2012 [facility] Patient Information 2015 [place], [place]. This information is not  intended to replace advice given to you by your health care provider. Make sure you discuss any questions you have with your health care provider.    Take the Prednisone as follows:             ..........Marland Kitchen

## 2014-05-10 ENCOUNTER — Encounter: Payer: Self-pay | Admitting: Family Medicine

## 2014-05-10 NOTE — Progress Notes (Signed)
S:  This 49 y.o. Female c/o sore throat w/ NP cough x 5 days. 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 She has seasonal allergies treated w/ fluticasone and oral cetirizine. She just started using these medications about 2 weeks ago. She denies fever, chills, HA, sinus pressure, difficulty swallowing, CP or tightness, SOB or choking, n/v/d, reflux symptoms, HA, dizziness or weakness.  Patient Active Problem List   Diagnosis Date Noted  . Routine general medical examination at a health care facility 07/13/2012    Prior to Admission medications   Medication Sig Start Date End Date Taking? Authorizing Provider  Cetirizine HCl (ZYRTEC ALLERGY PO) Take by mouth.     Yes Historical Provider, MD  fluticasone (FLONASE) 50 MCG/ACT nasal spray Place 2 sprays into both nostrils daily. 04/29/14  Yes Elby Beck, FNP  GuaiFENesin (MUCINEX PO) Take by mouth daily.   Yes Historical Provider, MD  Vitamin D, Ergocalciferol, (DRISDOL) 50000 UNITS CAPS capsule Take one capsule by mouth every week for 8 weeks then take 1 capsule by mouth every other week. 05/06/14  Yes Elby Beck, FNP    Past Surgical History  Procedure Laterality Date  . Cesarean section  2007    SOC and FAM HX reviewed.  ROS: As per HPI.  O: Filed Vitals:   05/09/14 1146  BP: 118/80  Pulse: 78  Temp: 98.4 F (36.9 C)  Resp: 16    GEN: In NAD: WN,WD. HENT: [place]/AT; EOMI w/ injected conj and clear sclerae. Ext ears/EACs/TMs normal. Post ph erythematous w/o exudate or lesions. Sinuses NT w/ percussion. NECK; Supple w/o LAN. COR; RRR. LUNGS: CTA; no accessory muscle use. No wheezes or rhonchi. SKIN: W&D; intact w/o rashes or pallor. NEURO: A&O x 3; CNs intact. Nonfocal.  A/P: Bronchospasm with bronchitis, acute- Hycodan syrup 5 mL every 8 hours prn cough. Trial Prednisone taper over 6 days.   Allergic rhinitis due to pollen- Continue using allergy medications.   Meds ordered this  encounter  Medications  . GuaiFENesin (MUCINEX PO)    Sig: Take by mouth daily.  Marland Kitchen HYDROcodone-homatropine (HYCODAN) 5-1.5 MG/5ML syrup    Sig: Take 5 mLs by mouth every 8 (eight) hours as needed for cough.    Dispense:  120 mL    Refill:  0  . predniSONE (DELTASONE) 10 MG tablet    Sig: Take 6 tablets in divided doses on Day1, then 1 less tablet daily. Take all doses with food.    Dispense:  21 tablet    Refill:  0

## 2014-09-13 ENCOUNTER — Encounter: Payer: BLUE CROSS/BLUE SHIELD | Admitting: Gynecology

## 2014-10-09 ENCOUNTER — Other Ambulatory Visit: Payer: Self-pay

## 2014-10-09 DIAGNOSIS — Z1231 Encounter for screening mammogram for malignant neoplasm of breast: Secondary | ICD-10-CM

## 2014-10-16 ENCOUNTER — Ambulatory Visit
Admission: RE | Admit: 2014-10-16 | Discharge: 2014-10-16 | Disposition: A | Discharge status: Home / Self Care | Payer: BLUE CROSS/BLUE SHIELD | Source: Ambulatory Visit

## 2014-10-16 DIAGNOSIS — Z1231 Encounter for screening mammogram for malignant neoplasm of breast: Secondary | ICD-10-CM

## 2014-11-08 ENCOUNTER — Ambulatory Visit (INDEPENDENT_AMBULATORY_CARE_PROVIDER_SITE_OTHER): Payer: BLUE CROSS/BLUE SHIELD | Admitting: Gynecology

## 2014-11-08 ENCOUNTER — Encounter: Payer: Self-pay | Admitting: Gynecology

## 2014-11-08 VITALS — BP 112/72 | Ht 60.0 in | Wt 145.0 lb

## 2014-11-08 DIAGNOSIS — N926 Irregular menstruation, unspecified: Secondary | ICD-10-CM | POA: Diagnosis not present

## 2014-11-08 DIAGNOSIS — Z01419 Encounter for gynecological examination (general) (routine) without abnormal findings: Secondary | ICD-10-CM

## 2014-11-08 NOTE — Progress Notes (Signed)
Emily Lara Jul 24, 1965 [id number]        49 y.o.  [id number] for annual exam.  Several issues noted below.  Past medical history,surgical history, problem list, medications, allergies, family history and social history were all reviewed and documented as reviewed in the EPIC chart.  ROS:  Performed with pertinent positives and negatives included in the history, assessment and plan.   Additional significant findings :  none   Exam: Kim Counsellor Vitals:   11/08/14 1431  BP: 112/72  Height: 5' (1.524 m)  Weight: 145 lb (65.772 kg)   General appearance:  Normal affect, orientation and appearance. Skin: Grossly normal HEENT: Without gross lesions.  No cervical or supraclavicular adenopathy. Thyroid normal.  Lungs:  Clear without wheezing, rales or rhonchi Cardiac: RR, without RMG Abdominal:  Soft, nontender, without masses, guarding, rebound, organomegaly or hernia Breasts:  Examined lying and sitting without masses, retractions, discharge or axillary adenopathy. Pelvic:  Ext/BUS/vagina normal  Cervix normal  Uterus anteverted, normal size, shape and contour, midline and mobile nontender   Adnexa  Without masses or tenderness    Anus and perineum  Normal excepting external hemorrhoid  Rectovaginal  Normal sphincter tone without palpated masses or tenderness.    Assessment/Plan:  49 y.o. [id number] female for annual exam with mild irregular menses, condom contraception.   1. Irregular menses. Patient notes that she skipped her menses several times this past year. Also notes that they are getting a little closer together every 3 weeks. No bleeding in between or prolonged bleeding. Check TSH FSH. Keep menstrual calendar as long as no atypical prolonged bleeding them will monitor. 2. External hemorrhoid. Patient saw general surgeon in the past but did not want to have surgery. She again declines referral and just wants to watch. 3. Pap smear/HPV negative 2014. No Pap smear done  today. Plan repeat Pap smear in 3-5 year interval. No history of abnormal Pap smears previously. 4. Mammography 09/2014. Continue with annual mammography. SBE monthly reviewed. 5. Health maintenance. No routine lab work done as she has had this done earlier this year by her primary physician. Follow up for hormone levels otherwise annual exam in one year. Sooner if significantly atypical bleeding.   Anastasio Auerbach MD, 2:52 PM 11/08/2014

## 2014-11-08 NOTE — Patient Instructions (Signed)

## 2014-11-09 LAB — FOLLICLE STIMULATING HORMONE: FSH: 27.3 m[IU]/mL

## 2015-07-22 ENCOUNTER — Ambulatory Visit (INDEPENDENT_AMBULATORY_CARE_PROVIDER_SITE_OTHER): Payer: BLUE CROSS/BLUE SHIELD | Admitting: Family Medicine

## 2015-07-22 ENCOUNTER — Encounter: Payer: Self-pay | Admitting: Family Medicine

## 2015-07-22 VITALS — BP 109/72 | HR 76 | Temp 98.2°F | Resp 16 | Ht 60.0 in | Wt 144.0 lb

## 2015-07-22 DIAGNOSIS — E559 Vitamin D deficiency, unspecified: Secondary | ICD-10-CM

## 2015-07-22 DIAGNOSIS — Z130 Encounter for screening for diseases of the blood and blood-forming organs and certain disorders involving the immune mechanism: Secondary | ICD-10-CM

## 2015-07-22 DIAGNOSIS — R739 Hyperglycemia, unspecified: Secondary | ICD-10-CM

## 2015-07-22 DIAGNOSIS — R7309 Other abnormal glucose: Secondary | ICD-10-CM

## 2015-07-22 DIAGNOSIS — M722 Plantar fascial fibromatosis: Secondary | ICD-10-CM

## 2015-07-22 DIAGNOSIS — Z0000 Encounter for general adult medical examination without abnormal findings: Secondary | ICD-10-CM

## 2015-07-22 DIAGNOSIS — Z1389 Encounter for screening for other disorder: Secondary | ICD-10-CM

## 2015-07-22 LAB — COMPLETE METABOLIC PANEL WITH GFR
ALT: 50 U/L — AB (ref 6–29)
AST: 32 U/L (ref 10–35)
Albumin: 4.4 g/dL (ref 3.6–5.1)
Alkaline Phosphatase: 49 U/L (ref 33–115)
BILIRUBIN TOTAL: 1.2 mg/dL (ref 0.2–1.2)
BUN: 18 mg/dL (ref 7–25)
CHLORIDE: 104 mmol/L (ref 98–110)
CO2: 23 mmol/L (ref 20–31)
CREATININE: 0.5 mg/dL (ref 0.50–1.10)
Calcium: 9.1 mg/dL (ref 8.6–10.2)
GFR, Est Non African American: >89 mL/min (ref >=60)
GLUCOSE: 85 mg/dL (ref 65–99)
Potassium: 4 mmol/L (ref 3.5–5.3)
SODIUM: 137 mmol/L (ref 135–146)
TOTAL PROTEIN: 7.1 g/dL (ref 6.1–8.1)

## 2015-07-22 LAB — CBC
HCT: 38.6 % (ref 35.0–45.0)
Hemoglobin: 12.6 g/dL (ref 11.7–15.5)
MCH: 30.2 pg (ref 27.0–33.0)
MCHC: 32.6 g/dL (ref 32.0–36.0)
MCV: 92.6 fL (ref 80.0–100.0)
MPV: 9.8 fL (ref 7.5–12.5)
Platelets: 302 10*3/uL (ref 140–400)
RBC: 4.17 MIL/uL (ref 3.80–5.10)
RDW: 13.6 % (ref 11.0–15.0)
WBC: 6.6 10*3/uL (ref 3.8–10.8)

## 2015-07-22 LAB — LIPID PANEL
CHOL/HDL RATIO: 2.8 ratio (ref <=5.0)
Cholesterol: 152 mg/dL (ref 125–200)
HDL: 55 mg/dL (ref >=46)
LDL Cholesterol: 88 mg/dL (ref <130)
Triglycerides: 47 mg/dL (ref <150)
VLDL: 9 mg/dL (ref <30)

## 2015-07-22 LAB — HEMOGLOBIN A1C
Hgb A1c MFr Bld: 5.8 % — ABNORMAL HIGH (ref <5.7)
MEAN PLASMA GLUCOSE: 120 mg/dL

## 2015-07-22 NOTE — Progress Notes (Signed)
Subjective:    Patient ID: Emily Lara, female    DOB: 20-Feb-1965, 50 y.o.   MRN: [id number]  HPI This is a 50 yo female who presents today for CPE. She is married with 2 children (10,13). 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   Last CPE- 04/29/14 Mammo- 10/17/14 Pap- 01/29/13, sees Dr. Phineas Real annually Colonoscopy- na Tdap- 04/29/14 Flu- annual Eye- annual Dental- regular Exercise- rides exercise bike most days   Past Medical History  Diagnosis Date  . Allergy    Past Surgical History  Procedure Laterality Date  . Cesarean section  2007   Family History  Problem Relation Age of Onset  . Hyperlipidemia Father   . Thyroid disease Father   . Alcohol abuse Neg Hx   . Cancer Neg Hx   . COPD Neg Hx   . Depression Neg Hx   . Diabetes Neg Hx   . Drug abuse Neg Hx   . Early death Neg Hx   . Hearing loss Neg Hx   . Heart disease Neg Hx   . Hypertension Neg Hx   . Kidney disease Neg Hx   . Stroke Neg Hx    Social History  Substance Use Topics  . Smoking status: Never Smoker   . Smokeless tobacco: Never Used  . Alcohol Use: No      Review of Systems  Constitutional: Negative.   HENT: Negative.   Eyes: Negative.   Respiratory: Negative.   Cardiovascular: Negative.   Gastrointestinal: Negative.   Endocrine: Negative.   Genitourinary: Negative.   Musculoskeletal:       Right knee pain, right heel pain- relief with ibuprofen.   Skin: Negative.   Allergic/Immunologic: Positive for environmental allergies.  Neurological: Negative.   Hematological: Negative.   Psychiatric/Behavioral: Negative.        Objective:   Physical Exam  Constitutional: She is oriented to person, place, and time. She appears well-developed and well-nourished. No distress.  HENT:  Head: Normocephalic and atraumatic.  Right Ear: External ear normal.  Left Ear: External ear normal.  Nose: Nose normal.  Mouth/Throat: Oropharynx is clear and moist. No oropharyngeal exudate.    Eyes: Conjunctivae are normal. Pupils are equal, round, and reactive to light.  Neck: Normal range of motion. Neck supple. No thyromegaly present.  Cardiovascular: Normal rate, regular rhythm, normal heart sounds and intact distal pulses.   Pulmonary/Chest: Effort normal and breath sounds normal.  Abdominal: Soft. Bowel sounds are normal.  Musculoskeletal: Normal range of motion. She exhibits tenderness (TTP right heel).  Lymphadenopathy:    She has no cervical adenopathy.  Neurological: She is alert and oriented to person, place, and time. She has normal reflexes.  Skin: Skin is warm and dry. She is not diaphoretic.  Psychiatric: She has a normal mood and affect. Her behavior is normal. Judgment and thought content normal.  Vitals reviewed.     BP 109/72 mmHg  Pulse 76  Temp(Src) 98.2 F (36.8 C) (Oral)  Resp 16  Ht 5' (1.524 m)  Wt 144 lb (65.318 kg)  BMI 28.12 kg/m2  SpO2 98%  LMP 06/30/2015 Wt Readings from Last 3 Encounters:  07/22/15 144 lb (65.318 kg)  11/08/14 145 lb (65.772 kg)  05/09/14 140 lb 12.8 oz (63.866 kg)      Assessment & Plan:  1. Annual physical exam - - Discussed and encouraged healthy lifestyle choices- adequate sleep, regular exercise, stress management and healthy food choices.   2. Elevated blood  sugar - COMPLETE METABOLIC PANEL WITH GFR - Hemoglobin A1c - Lipid panel  3. Screening for deficiency anemia - CBC  4. Vitamin D deficiency - VITAMIN D 25 Hydroxy (Vit-D Deficiency, Fractures)  5. Screening for nephropathy - COMPLETE METABOLIC PANEL WITH GFR  6. Plantar fasciitis, right - Provided written and verbal information regarding diagnosis and treatment. - encouraged her to wear supportive shoes at all times and provided stretching exercises for her - otc NSAIDs PRN   Clarene Reamer, FNP-BC  [facility] [place] [facility]  07/22/2015 2:25 PM f

## 2015-07-22 NOTE — Patient Instructions (Addendum)
Please get over the counter vitamin D3 2000 IU Plantar Fasciitis With Rehab The plantar fascia is a fibrous, ligament-like, soft-tissue structure that spans the bottom of the foot. Plantar fasciitis, also called heel spur syndrome, is a condition that causes pain in the foot due to inflammation of the tissue. SYMPTOMS   Pain and tenderness on the underneath side of the foot.  Pain that worsens with standing or walking. CAUSES  Plantar fasciitis is caused by irritation and injury to the plantar fascia on the underneath side of the foot. Common mechanisms of injury include:  Direct trauma to bottom of the foot.  Damage to a small nerve that runs under the foot where the main fascia attaches to the heel bone.  Stress placed on the plantar fascia due to bone spurs. RISK INCREASES WITH:   Activities that place stress on the plantar fascia (running, jumping, pivoting, or cutting).  Poor strength and flexibility.  Improperly fitted shoes.  Tight calf muscles.  Flat feet.  Failure to warm-up properly before activity.  Obesity. PREVENTION  Warm up and stretch properly before activity.  Allow for adequate recovery between workouts.  Maintain physical fitness:  Strength, flexibility, and endurance.  Cardiovascular fitness.  Maintain a health body weight.  Avoid stress on the plantar fascia.  Wear properly fitted shoes, including arch supports for individuals who have flat feet. PROGNOSIS  If treated properly, then the symptoms of plantar fasciitis usually resolve without surgery. However, occasionally surgery is necessary. RELATED COMPLICATIONS   Recurrent symptoms that may result in a chronic condition.  Problems of the lower back that are caused by compensating for the injury, such as limping.  Pain or weakness of the foot during push-off following surgery.  Chronic inflammation, scarring, and partial or complete fascia tear, occurring more often from repeated  injections. TREATMENT  Treatment initially involves the use of ice and medication to help reduce pain and inflammation. The use of strengthening and stretching exercises may help reduce pain with activity, especially stretches of the Achilles tendon. These exercises may be performed at home or with a therapist. Your caregiver may recommend that you use heel cups of arch supports to help reduce stress on the plantar fascia. Occasionally, corticosteroid injections are given to reduce inflammation. If symptoms persist for greater than 6 months despite non-surgical (conservative), then surgery may be recommended.  MEDICATION   If pain medication is necessary, then nonsteroidal anti-inflammatory medications, such as aspirin and ibuprofen, or other minor pain relievers, such as acetaminophen, are often recommended.  Do not take pain medication within 7 days before surgery.  Prescription pain relievers may be given if deemed necessary by your caregiver. Use only as directed and only as much as you need.  Corticosteroid injections may be given by your caregiver. These injections should be reserved for the most serious cases, because they may only be given a certain number of times. HEAT AND COLD  Cold treatment (icing) relieves pain and reduces inflammation. Cold treatment should be applied for 10 to 15 minutes every 2 to 3 hours for inflammation and pain and immediately after any activity that aggravates your symptoms. Use ice packs or massage the area with a piece of ice (ice massage).  Heat treatment may be used prior to performing the stretching and strengthening activities prescribed by your caregiver, physical therapist, or athletic trainer. Use a heat pack or soak the injury in warm water. SEEK IMMEDIATE MEDICAL CARE IF:  Treatment seems to offer no benefit, or the  condition worsens.  Any medications produce adverse side effects. EXERCISES RANGE OF MOTION (ROM) AND STRETCHING EXERCISES -  Plantar Fasciitis (Heel Spur Syndrome) These exercises may help you when beginning to rehabilitate your injury. Your symptoms may resolve with or without further involvement from your physician, physical therapist or athletic trainer. While completing these exercises, remember:   Restoring tissue flexibility helps normal motion to return to the joints. This allows healthier, less painful movement and activity.  An effective stretch should be held for at least 30 seconds.  A stretch should never be painful. You should only feel a gentle lengthening or release in the stretched tissue. RANGE OF MOTION - Toe Extension, Flexion  Sit with your right / left leg crossed over your opposite knee.  Grasp your toes and gently pull them back toward the top of your foot. You should feel a stretch on the bottom of your toes and/or foot.  Hold this stretch for __________ seconds.  Now, gently pull your toes toward the bottom of your foot. You should feel a stretch on the top of your toes and or foot.  Hold this stretch for __________ seconds. Repeat __________ times. Complete this stretch __________ times per day.  RANGE OF MOTION - Ankle Dorsiflexion, Active Assisted  Remove shoes and sit on a chair that is preferably not on a carpeted surface.  Place right / left foot under knee. Extend your opposite leg for support.  Keeping your heel down, slide your right / left foot back toward the chair until you feel a stretch at your ankle or calf. If you do not feel a stretch, slide your bottom forward to the edge of the chair, while still keeping your heel down.  Hold this stretch for __________ seconds. Repeat __________ times. Complete this stretch __________ times per day.  STRETCH - Gastroc, Standing  Place hands on wall.  Extend right / left leg, keeping the front knee somewhat bent.  Slightly point your toes inward on your back foot.  Keeping your right / left heel on the floor and your knee  straight, shift your weight toward the wall, not allowing your back to arch.  You should feel a gentle stretch in the right / left calf. Hold this position for __________ seconds. Repeat __________ times. Complete this stretch __________ times per day. STRETCH - Soleus, Standing  Place hands on wall.  Extend right / left leg, keeping the other knee somewhat bent.  Slightly point your toes inward on your back foot.  Keep your right / left heel on the floor, bend your back knee, and slightly shift your weight over the back leg so that you feel a gentle stretch deep in your back calf.  Hold this position for __________ seconds. Repeat __________ times. Complete this stretch __________ times per day. STRETCH - Gastrocsoleus, Standing  Note: This exercise can place a lot of stress on your foot and ankle. Please complete this exercise only if specifically instructed by your caregiver.   Place the ball of your right / left foot on a step, keeping your other foot firmly on the same step.  Hold on to the wall or a rail for balance.  Slowly lift your other foot, allowing your body weight to press your heel down over the edge of the step.  You should feel a stretch in your right / left calf.  Hold this position for __________ seconds.  Repeat this exercise with a slight bend in your right / left  knee. Repeat __________ times. Complete this stretch __________ times per day.  STRENGTHENING EXERCISES - Plantar Fasciitis (Heel Spur Syndrome)  These exercises may help you when beginning to rehabilitate your injury. They may resolve your symptoms with or without further involvement from your physician, physical therapist or athletic trainer. While completing these exercises, remember:   Muscles can gain both the endurance and the strength needed for everyday activities through controlled exercises.  Complete these exercises as instructed by your physician, physical therapist or athletic trainer.  Progress the resistance and repetitions only as guided. STRENGTH - Towel Curls  Sit in a chair positioned on a non-carpeted surface.  Place your foot on a towel, keeping your heel on the floor.  Pull the towel toward your heel by only curling your toes. Keep your heel on the floor.  If instructed by your physician, physical therapist or athletic trainer, add ____________________ at the end of the towel. Repeat __________ times. Complete this exercise __________ times per day. STRENGTH - Ankle Inversion  Secure one end of a rubber exercise band/tubing to a fixed object (table, pole). Loop the other end around your foot just before your toes.  Place your fists between your knees. This will focus your strengthening at your ankle.  Slowly, pull your big toe up and in, making sure the band/tubing is positioned to resist the entire motion.  Hold this position for __________ seconds.  Have your muscles resist the band/tubing as it slowly pulls your foot back to the starting position. Repeat __________ times. Complete this exercises __________ times per day.    This information is not intended to replace advice given to you by your health care provider. Make sure you discuss any questions you have with your health care provider.   Document Released: 02/01/2005 Document Revised: 06/18/2014 Document Reviewed: 05/16/2008 Elsevier Interactive Patient Education 2016 Elsevier Inc.   Vitamin D Deficiency Vitamin D deficiency is when your body does not have enough vitamin D. Vitamin D is important because:  It helps your body use other minerals that your body needs.  It helps keep your bones strong and healthy.  It may help to prevent some diseases.  It helps your heart and other muscles work well. You can get vitamin D by:  Eating foods with vitamin D in them.  Drinking or eating milk or other foods that have had vitamin D added to them.  Taking a vitamin D supplement.  Being in the  sun. Not getting enough vitamin D can make your bones become soft. It can also cause other health problems. HOME CARE  Take medicines and supplements only as told by your doctor.  Eat foods that have vitamin D. These include:  Dairy products, cereals, or juices with added vitamin D. Check the label for vitamin D.   Fatty fish like salmon or trout.   Eggs.   Oysters.   Do not use tanning beds.   Stay at a healthy weight. Lose weight, if needed.   Keep all follow-up visits as told by your doctor. This is important.  GET HELP IF:  Your symptoms do not go away.  You feel sick to your stomach (nauseous).  Youthrow up (vomit).   You poop less often than usual or you have trouble pooping (constipation).   This information is not intended to replace advice given to you by your health care provider. Make sure you discuss any questions you have with your health care provider.   Document Released: 01/21/2011 Document  Revised: 10/23/2014 Document Reviewed: 06/19/2014 Elsevier Interactive Patient Education [employer].   IF you received an x-ray today, you will receive an invoice from [facility]. Please contact [facility] at [phone] with questions or concerns regarding your invoice.   IF you received labwork today, you will receive an invoice from [employer]. Please contact [facility] at [phone] with questions or concerns regarding your invoice.   Our billing staff will not be able to assist you with questions regarding bills from these companies.  You will be contacted with the lab results as soon as they are available. The fastest way to get your results is to activate your My Chart account. Instructions are located on the last page of this paperwork. If you have not heard from [place] regarding the results in 2 weeks, please contact this office.

## 2015-07-23 LAB — VITAMIN D 25 HYDROXY (VIT D DEFICIENCY, FRACTURES): VIT D 25 HYDROXY: 16 ng/mL — AB (ref 30–100)

## 2015-07-26 DIAGNOSIS — E559 Vitamin D deficiency, unspecified: Secondary | ICD-10-CM | POA: Insufficient documentation

## 2015-07-28 ENCOUNTER — Encounter: Payer: Self-pay | Admitting: Family Medicine

## 2015-11-20 ENCOUNTER — Other Ambulatory Visit: Payer: Self-pay | Admitting: Gynecology

## 2015-11-20 DIAGNOSIS — Z1231 Encounter for screening mammogram for malignant neoplasm of breast: Secondary | ICD-10-CM

## 2015-12-09 ENCOUNTER — Ambulatory Visit
Admission: RE | Admit: 2015-12-09 | Discharge: 2015-12-09 | Disposition: A | Discharge status: Home / Self Care | Payer: BLUE CROSS/BLUE SHIELD | Source: Ambulatory Visit | Attending: Gynecology | Admitting: Gynecology

## 2015-12-09 DIAGNOSIS — Z1231 Encounter for screening mammogram for malignant neoplasm of breast: Secondary | ICD-10-CM

## 2015-12-25 ENCOUNTER — Ambulatory Visit (INDEPENDENT_AMBULATORY_CARE_PROVIDER_SITE_OTHER): Payer: BLUE CROSS/BLUE SHIELD | Admitting: Gynecology

## 2015-12-25 ENCOUNTER — Encounter: Payer: Self-pay | Admitting: Gynecology

## 2015-12-25 ENCOUNTER — Encounter: Payer: BLUE CROSS/BLUE SHIELD | Admitting: Gynecology

## 2015-12-25 VITALS — BP 118/76 | Ht 60.0 in | Wt 147.0 lb

## 2015-12-25 DIAGNOSIS — Z01419 Encounter for gynecological examination (general) (routine) without abnormal findings: Secondary | ICD-10-CM | POA: Diagnosis not present

## 2015-12-25 DIAGNOSIS — N951 Menopausal and female climacteric states: Secondary | ICD-10-CM | POA: Diagnosis not present

## 2015-12-25 DIAGNOSIS — Z23 Encounter for immunization: Secondary | ICD-10-CM | POA: Diagnosis not present

## 2015-12-25 DIAGNOSIS — N926 Irregular menstruation, unspecified: Secondary | ICD-10-CM

## 2015-12-25 LAB — TSH: TSH: 1.42 m[IU]/L

## 2015-12-25 NOTE — Patient Instructions (Signed)

## 2015-12-25 NOTE — Progress Notes (Addendum)
    Emily Lara December 16, 1965 [id number]        50 y.o.  [id number]  for annual exam.  Several issues noted below  Past medical history,surgical history, problem list, medications, allergies, family history and social history were all reviewed and documented as reviewed in the EPIC chart.  ROS:  Performed with pertinent positives and negatives included in the history, assessment and plan.   Additional significant findings :  None   Exam: Caryn Bee assistant Vitals:   12/25/15 1406  BP: 118/76  Weight: 147 lb (66.7 kg)  Height: 5' (1.524 m)   Body mass index is 28.71 kg/m.  General appearance:  Normal affect, orientation and appearance. Skin: Grossly normal HEENT: Without gross lesions.  No cervical or supraclavicular adenopathy. Thyroid normal.  Lungs:  Clear without wheezing, rales or rhonchi Cardiac: RR, without RMG Abdominal:  Soft, nontender, without masses, guarding, rebound, organomegaly or hernia Breasts:  Examined lying and sitting without masses, retractions, discharge or axillary adenopathy. Pelvic:  Ext, BUS, Vagina normal  Cervix normal  Uterus anteverted, normal size, shape and contour, midline and mobile nontender   Adnexa without masses or tenderness    Anus and perineum normal   Rectovaginal normal sphincter tone without palpated masses or tenderness.    Assessment/Plan:  50 y.o. [id number] female for annual exam with irregular menses, abstinent contraception.   1. Patients menses have become more irregular where she will skip one or 2 months at a time. Also starting to have worsening hot flushes and night sweats. [place] last year was 46. Will recheck [place] and TSH now. I discussed what to expect in the perimenopausal time frame. I reviewed options for symptom relief to include observation, OTC products like soy based, pharmacologic nonhormonal such as Effexor or and HRT. The risks benefits of each choice discussed to include the risks of HRT such as stroke heart  attack DVT and breast cancer. At this point the patient is not interested in starting HRT but will try OTC products. Will follow up if worsening symptoms and wants to rediscuss options or if irregular bleeding such as prolonged or atypical bleeding. If less frequent but regular menses when occur them will follow for now. 2. Contraception. Patient states this is not an issue and is not interested in discussing options. 3. Mammography 11/2015. Continue with annual mammography when due. SBE monthly reviewed. 4. Pap smear/HPV 2014 negative. No Pap smear done today. No history of significant abnormal Pap smears. Plan repeat Pap smear at 5 year interval. 5. Health maintenance. Patient reports routine blood work done elsewhere. Follow up for lab results as above. Try OTC products. Follow up if once to rediscuss HRT. Follow up in one year for annual exam.  Greater than 10 minutes of my time in excess of her breast and pelvic exam was spent in direct face to face counseling and coordination of care in regards to her her menopausal symptoms and irregular menses as well as options for management with risks/benefits of each choice discussed.    Anastasio Auerbach MD, 2:34 PM 12/25/2015

## 2015-12-25 NOTE — Addendum Note (Signed)
Addended by: Nelva Nay on: 12/25/2015 02:46 PM   Modules accepted: Orders

## 2015-12-26 ENCOUNTER — Telehealth: Payer: Self-pay | Admitting: Gynecology

## 2015-12-26 LAB — FOLLICLE STIMULATING HORMONE: FSH: 58.8 m[IU]/mL

## 2015-12-26 NOTE — Telephone Encounter (Signed)
Per DPR access note on file I called patient and left voice mail with this explanation.

## 2015-12-26 NOTE — Telephone Encounter (Signed)
I was thinking that we would schedule an ultrasound if her skipped menses became more irregular. Tell patient that she does not need an ultrasound that would just follow her periods for now.

## 2015-12-26 NOTE — Telephone Encounter (Signed)
Dr. Loetta Rough- when I relayed this to her she was confused because she knew nothing of needing an u/s.  Note in chart did not mention it. Please advise.

## 2015-12-26 NOTE — Telephone Encounter (Signed)
Tell patient her lab work showed her hemoglobin to be decent. Her [place] was borderline elevated which may mean she is entering into menopause. Will discuss this at her ultrasound appointment

## 2015-12-26 NOTE — Telephone Encounter (Signed)
I added this to her result note that Dr. Loetta Rough had already put recommendations on. I have left a message for her to call and will relay this to her at that time.

## 2016-02-23 ENCOUNTER — Ambulatory Visit (INDEPENDENT_AMBULATORY_CARE_PROVIDER_SITE_OTHER): Payer: BLUE CROSS/BLUE SHIELD | Admitting: Physician Assistant

## 2016-02-23 VITALS — BP 119/79 | HR 77 | Temp 97.6°F | Resp 18 | Ht 60.0 in | Wt 145.0 lb

## 2016-02-23 DIAGNOSIS — E049 Nontoxic goiter, unspecified: Secondary | ICD-10-CM

## 2016-02-23 DIAGNOSIS — B9789 Other viral agents as the cause of diseases classified elsewhere: Secondary | ICD-10-CM | POA: Diagnosis not present

## 2016-02-23 DIAGNOSIS — J069 Acute upper respiratory infection, unspecified: Secondary | ICD-10-CM

## 2016-02-23 MED ORDER — HYDROCODONE-HOMATROPINE 5-1.5 MG/5ML PO SYRP: 2.5000 mL | ORAL_SOLUTION | Freq: Every evening | ORAL | 0 refills | Status: DC | PRN

## 2016-02-23 MED ORDER — CETIRIZINE-PSEUDOEPHEDRINE ER 5-120 MG PO TB12: 1.0000 | ORAL_TABLET | Freq: Two times a day (BID) | ORAL | 0 refills | Status: DC

## 2016-02-23 NOTE — Progress Notes (Signed)
02/23/2016 11:03 AM   DOB: August 07, 1965 / MRN: [id number]  SUBJECTIVE:  Emily Lara is a 51 y.o. female presenting for sore throat and cough that started three days ago.  She has tried dayquil and ibuprofen and they did help. Cough is keeping her up at night. Report fever at first however this has resolved.  Denies chills, nausea.  She is drinking lots of fluids.   Complains of "something in my throat" as she point to her thyroid gland.  Tells me her father has a history of thyroid problems.  She is not complaining of skin, hair, nail, bowel changes today.   She has No Known Allergies.   She  has a past medical history of Allergy.    She  reports that she has never smoked. She has never used smokeless tobacco. She reports that she does not drink alcohol or use drugs. She  reports that she currently engages in sexual activity and has had female partners. She reports using the following method of birth control/protection: None. The patient  has a past surgical history that includes Cesarean section (2007).  Her family history includes Hyperlipidemia in her father; Thyroid disease in her father.  Review of Systems  Constitutional: Negative for chills and fever.  HENT: Positive for congestion. Negative for ear discharge and ear pain.   Respiratory: Positive for cough. Negative for hemoptysis, sputum production, shortness of breath and wheezing.   Cardiovascular: Negative for chest pain and leg swelling.  Musculoskeletal: Negative for myalgias.  Neurological: Positive for headaches. Negative for sensory change.    The problem list and medications were reviewed and updated by myself where necessary and exist elsewhere in the encounter.   OBJECTIVE:  BP 119/79   Pulse 77   Temp 97.6 F (36.4 C) (Oral)   Resp 18   Ht 5' (1.524 m)   Wt 145 lb (65.8 kg)   SpO2 100%   BMI 28.32 kg/m   Physical Exam  Constitutional: She is oriented to person, place, and time.  HENT:  Head:    Right  Ear: Tympanic membrane and external ear normal.  Left Ear: Tympanic membrane and external ear normal.  Nose: Nose normal. Right sinus exhibits no maxillary sinus tenderness and no frontal sinus tenderness. Left sinus exhibits no maxillary sinus tenderness and no frontal sinus tenderness.  Mouth/Throat: Uvula is midline, oropharynx is clear and moist and mucous membranes are normal. No oropharyngeal exudate.  Eyes: Conjunctivae are normal. Pupils are equal, round, and reactive to light.  Cardiovascular: Normal rate, regular rhythm and normal heart sounds.   Pulmonary/Chest: Effort normal and breath sounds normal. She has no rales.  Musculoskeletal: Normal range of motion.  Neurological: She is alert and oriented to person, place, and time.  Skin: Skin is warm and dry. No rash noted. She is not diaphoretic. No erythema.  Psychiatric: Her behavior is normal.    No results found for this or any previous visit (from the past 72 hour(s)).  No results found.  ASSESSMENT AND PLAN:  Emily Lara was seen today for cough and sore throat.  Diagnoses and all orders for this visit:  Enlarged thyroid gland: Will scan is panel is abnormal or her complaints do not improve with resolution or problem two.  -     Thyroid Panel With TSH  Viral URI with cough -     cetirizine-pseudoephedrine (ZYRTEC-D) 5-120 MG tablet; Take 1 tablet by mouth 2 (two) times daily. -     HYDROcodone-homatropine (HYCODAN)  5-1.5 MG/5ML syrup; Take 2.5-5 mLs by mouth at bedtime as needed.    The patient is advised to call or return to clinic if she does not see an improvement in symptoms, or to seek the care of the closest emergency department if she worsens with the above plan.   Philis Fendt, MHS, PA-C [facility] 02/23/2016 11:03 AM

## 2016-02-23 NOTE — Patient Instructions (Signed)
     IF you received an x-ray today, you will receive an invoice from [facility]. Please contact [facility] at [phone] with questions or concerns regarding your invoice.   IF you received labwork today, you will receive an invoice from [facility]. Please contact [facility] at [phone] with questions or concerns regarding your invoice.   Our billing staff will not be able to assist you with questions regarding bills from these companies.  You will be contacted with the lab results as soon as they are available. The fastest way to get your results is to activate your My Chart account. Instructions are located on the last page of this paperwork. If you have not heard from us regarding the results in 2 weeks, please contact this office.     

## 2016-02-24 LAB — THYROID PANEL WITH TSH
FREE THYROXINE INDEX: 1.9 (ref 1.2–4.9)
T3 Uptake Ratio: 26 % (ref 24–39)
T4 TOTAL: 7.2 ug/dL (ref 4.5–12.0)
TSH: 1.04 u[IU]/mL (ref 0.450–4.500)

## 2016-07-19 ENCOUNTER — Encounter: Payer: Self-pay | Admitting: Physician Assistant

## 2016-07-19 ENCOUNTER — Ambulatory Visit (INDEPENDENT_AMBULATORY_CARE_PROVIDER_SITE_OTHER): Payer: BLUE CROSS/BLUE SHIELD | Admitting: Physician Assistant

## 2016-07-19 VITALS — BP 108/73 | HR 85 | Temp 98.5°F | Resp 18 | Ht 59.45 in | Wt 145.0 lb

## 2016-07-19 DIAGNOSIS — R300 Dysuria: Secondary | ICD-10-CM | POA: Diagnosis not present

## 2016-07-19 DIAGNOSIS — N3000 Acute cystitis without hematuria: Secondary | ICD-10-CM

## 2016-07-19 DIAGNOSIS — R109 Unspecified abdominal pain: Secondary | ICD-10-CM

## 2016-07-19 LAB — POCT URINALYSIS DIP (MANUAL ENTRY)
BILIRUBIN UA: NEGATIVE
BILIRUBIN UA: NEGATIVE mg/dL
GLUCOSE UA: NEGATIVE mg/dL
Nitrite, UA: NEGATIVE
Protein Ur, POC: 30 mg/dL — AB
SPEC GRAV UA: 1.02 (ref 1.010–1.025)
UROBILINOGEN UA: 0.2 U/dL
pH, UA: 7 (ref 5.0–8.0)

## 2016-07-19 LAB — POCT URINE PREGNANCY: PREG TEST UR: NEGATIVE

## 2016-07-19 MED ORDER — SULFAMETHOXAZOLE-TRIMETHOPRIM 800-160 MG PO TABS: 1.0000 | ORAL_TABLET | Freq: Two times a day (BID) | ORAL | 0 refills | Status: DC

## 2016-07-19 NOTE — Patient Instructions (Addendum)
You need to hydrate a lot with 64 oz if not more of water. I would like you to take ibuprofen or tylenol for pain and fevers Your last kidney function last June was normal here.  Urinary Tract Infection, Adult A urinary tract infection (UTI) is an infection of any part of the urinary tract. The urinary tract includes the:  Kidneys.  Ureters.  Bladder.  Urethra.  These organs make, store, and get rid of pee (urine) in the body. Follow these instructions at home:  Take over-the-counter and prescription medicines only as told by your doctor.  If you were prescribed an antibiotic medicine, take it as told by your doctor. Do not stop taking the antibiotic even if you start to feel better.  Avoid the following drinks: ? Alcohol. ? Caffeine. ? Tea. ? Carbonated drinks.  Drink enough fluid to keep your pee clear or pale yellow.  Keep all follow-up visits as told by your doctor. This is important.  Make sure to: ? Empty your bladder often and completely. Do not to hold pee for long periods of time. ? Empty your bladder before and after sex. ? Wipe from front to back after a bowel movement if you are female. Use each tissue one time when you wipe. Contact a doctor if:  You have back pain.  You have a fever.  You feel sick to your stomach (nauseous).  You throw up (vomit).  Your symptoms do not get better after 3 days.  Your symptoms go away and then come back. Get help right away if:  You have very bad back pain.  You have very bad lower belly (abdominal) pain.  You are throwing up and cannot keep down any medicines or water. This information is not intended to replace advice given to you by your health care provider. Make sure you discuss any questions you have with your health care provider. Document Released: 07/21/2007 Document Revised: 07/10/2015 Document Reviewed: 12/23/2014 Elsevier Interactive Patient Education  2018 [employer].     IF you received an  x-ray today, you will receive an invoice from [facility]. Please contact [facility] at [phone] with questions or concerns regarding your invoice.   IF you received labwork today, you will receive an invoice from [place] [facility]. Please contact [facility] at [phone] with questions or concerns regarding your invoice.   Our billing staff will not be able to assist you with questions regarding bills from these companies.  You will be contacted with the lab results as soon as they are available. The fastest way to get your results is to activate your My Chart account. Instructions are located on the last page of this paperwork. If you have not heard from [place] regarding the results in 2 weeks, please contact this office.

## 2016-07-21 LAB — URINE CULTURE

## 2016-07-21 NOTE — Progress Notes (Signed)
PRIMARY CARE AT [facility] [address] [place] [address] [phone]  Date:  07/19/2016   Name:  Emily Lara   DOB:  1965-03-31   MRN:  [id number]  PCP:  Elby Beck, FNP    History of Present Illness:  Emily Lara is a 51 y.o. female patient who presents to PCP with  Chief Complaint  Patient presents with  . Dysuria    x 3 days      3 days of dysuria, and some abdominal pain.  She does have urinary frequency. No flank pain, hematuria, or fever.  No back pain. No abnormal vaginal discharge.   Patient Active Problem List   Diagnosis Date Noted  . Vitamin D insufficiency 07/26/2015  . Routine general medical examination at a health care facility 07/13/2012    Past Medical History:  Diagnosis Date  . Allergy     Past Surgical History:  Procedure Laterality Date  . CESAREAN SECTION  2007    Social History  Substance Use Topics  . Smoking status: Never Smoker  . Smokeless tobacco: Never Used  . Alcohol use No    Family History  Problem Relation Age of Onset  . Hyperlipidemia Father   . Thyroid disease Father   . Alcohol abuse Neg Hx   . Cancer Neg Hx   . COPD Neg Hx   . Depression Neg Hx   . Diabetes Neg Hx   . Drug abuse Neg Hx   . Early death Neg Hx   . Hearing loss Neg Hx   . Heart disease Neg Hx   . Hypertension Neg Hx   . Kidney disease Neg Hx   . Stroke Neg Hx     No Known Allergies  Medication list has been reviewed and updated.  No current outpatient prescriptions on file prior to visit.   No current facility-administered medications on file prior to visit.     ROS ROS otherwise unremarkable unless listed above.  Physical Examination: BP 108/73   Pulse 85   Temp 98.5 F (36.9 C) (Oral)   Resp 18   Ht 4' 11.45" (1.51 m)   Wt 145 lb (65.8 kg)   SpO2 95%   BMI 28.85 kg/m  Ideal Body Weight: Weight in (lb) to have BMI = 25: 125.4  Physical Exam  Constitutional: She is oriented to person, place, and time. She  appears well-developed and well-nourished. No distress.  HENT:  Head: Normocephalic and atraumatic.  Right Ear: External ear normal.  Left Ear: External ear normal.  Eyes: Conjunctivae and EOM are normal. Pupils are equal, round, and reactive to light.  Cardiovascular: Normal rate.   Pulmonary/Chest: Effort normal. No respiratory distress.  Abdominal: Soft. Normal appearance and bowel sounds are normal. There is tenderness in the suprapubic area. There is no CVA tenderness.  Neurological: She is alert and oriented to person, place, and time.  Skin: She is not diaphoretic.  Psychiatric: She has a normal mood and affect. Her behavior is normal.     Assessment and Plan: Emily Lara is a 51 y.o. female who is here today for cc of abdominal pain and dysuria Dysuria - Plan: POCT urinalysis dipstick, Urine culture, POCT urine pregnancy  Abdominal pain, unspecified abdominal location - Plan: POCT urinalysis dipstick, Urine culture, POCT urine pregnancy  Ivar Drape, PA-C [facility] 6/6/20187:41 PM \

## 2016-10-11 ENCOUNTER — Ambulatory Visit (INDEPENDENT_AMBULATORY_CARE_PROVIDER_SITE_OTHER): Payer: BLUE CROSS/BLUE SHIELD | Admitting: Physician Assistant

## 2016-10-11 ENCOUNTER — Encounter: Payer: Self-pay | Admitting: Physician Assistant

## 2016-10-11 VITALS — BP 120/78 | HR 83 | Temp 98.2°F | Resp 16 | Ht 59.0 in | Wt 144.0 lb

## 2016-10-11 DIAGNOSIS — R81 Glycosuria: Secondary | ICD-10-CM | POA: Diagnosis not present

## 2016-10-11 DIAGNOSIS — N3001 Acute cystitis with hematuria: Secondary | ICD-10-CM

## 2016-10-11 DIAGNOSIS — R300 Dysuria: Secondary | ICD-10-CM

## 2016-10-11 LAB — POCT URINALYSIS DIP (MANUAL ENTRY)
BILIRUBIN UA: NEGATIVE
BILIRUBIN UA: NEGATIVE mg/dL
Nitrite, UA: NEGATIVE
Protein Ur, POC: NEGATIVE mg/dL
SPEC GRAV UA: 1.01 (ref 1.010–1.025)
UROBILINOGEN UA: 0.2 U/dL
pH, UA: 7 (ref 5.0–8.0)

## 2016-10-11 LAB — GLUCOSE, POCT (MANUAL RESULT ENTRY): POC Glucose: 167 mg/dl — AB (ref 70–99)

## 2016-10-11 MED ORDER — CIPROFLOXACIN HCL 250 MG PO TABS: 250.0000 mg | ORAL_TABLET | Freq: Two times a day (BID) | ORAL | 0 refills | Status: AC

## 2016-10-11 NOTE — Progress Notes (Addendum)
PRIMARY CARE AT [facility] [address], [place] [address] [phone]  Date:  10/11/2016   Name:  Emily Lara   DOB:  06-07-1965   MRN:  [id number]  PCP:  Elby Beck, FNP    History of Present Illness:  Emily Lara is a 51 y.o. female patient who presents to PCP with  Chief Complaint  Patient presents with  . Dysuria    x 3 days. pt has hx of this     1 week of pain with urination.  She has frequency.  The pain is at the urinating afterward.  No hematuria, no nausea, fever, back pain, or abdominal pain.   She had gestational diabetes. Wt Readings from Last 3 Encounters:  10/11/16 144 lb (65.3 kg)  07/19/16 145 lb (65.8 kg)  02/23/16 145 lb (65.8 kg)    Results for orders placed or performed in visit on 10/11/16  POCT urinalysis dipstick  Result Value Ref Range   Color, UA yellow yellow   Clarity, UA clear clear   Glucose, UA =250 (A) negative mg/dL   Bilirubin, UA negative negative   Ketones, POC UA negative negative mg/dL   Spec Grav, UA 1.010 1.010 - 1.025   Blood, UA trace-intact (A) negative   pH, UA 7.0 5.0 - 8.0   Protein Ur, POC negative negative mg/dL   Urobilinogen, UA 0.2 0.2 or 1.0 E.U./dL   Nitrite, UA Negative Negative   Leukocytes, UA Small (1+) (A) Negative   Results for orders placed or performed in visit on 10/11/16  POCT urinalysis dipstick  Result Value Ref Range   Color, UA yellow yellow   Clarity, UA clear clear   Glucose, UA =250 (A) negative mg/dL   Bilirubin, UA negative negative   Ketones, POC UA negative negative mg/dL   Spec Grav, UA 1.010 1.010 - 1.025   Blood, UA trace-intact (A) negative   pH, UA 7.0 5.0 - 8.0   Protein Ur, POC negative negative mg/dL   Urobilinogen, UA 0.2 0.2 or 1.0 E.U./dL   Nitrite, UA Negative Negative   Leukocytes, UA Small (1+) (A) Negative  POCT glucose (manual entry)  Result Value Ref Range   POC Glucose 167 (A) 70 - 99 mg/dl    Patient Active Problem List   Diagnosis Date Noted   . Vitamin D insufficiency 07/26/2015  . Routine general medical examination at a health care facility 07/13/2012    Past Medical History:  Diagnosis Date  . Allergy     Past Surgical History:  Procedure Laterality Date  . CESAREAN SECTION  2007    Social History  Substance Use Topics  . Smoking status: Never Smoker  . Smokeless tobacco: Never Used  . Alcohol use No    Family History  Problem Relation Age of Onset  . Hyperlipidemia Father   . Thyroid disease Father   . Alcohol abuse Neg Hx   . Cancer Neg Hx   . COPD Neg Hx   . Depression Neg Hx   . Diabetes Neg Hx   . Drug abuse Neg Hx   . Early death Neg Hx   . Hearing loss Neg Hx   . Heart disease Neg Hx   . Hypertension Neg Hx   . Kidney disease Neg Hx   . Stroke Neg Hx     No Known Allergies  Medication list has been reviewed and updated.  Current Outpatient Prescriptions on File Prior to Visit  Medication Sig Dispense  Refill  . sulfamethoxazole-trimethoprim (BACTRIM DS,SEPTRA DS) 800-160 MG tablet Take 1 tablet by mouth 2 (two) times daily. (Patient not taking: Reported on 10/11/2016) 10 tablet 0   No current facility-administered medications on file prior to visit.     ROS ROS otherwise unremarkable unless listed above.  Physical Examination: BP 120/78   Pulse 83   Temp 98.2 F (36.8 C) (Oral)   Resp 16   Ht 4\' 11"  (1.499 m)   Wt 144 lb (65.3 kg)   SpO2 98%   BMI 29.08 kg/m  Ideal Body Weight: Weight in (lb) to have BMI = 25: 123.5  Physical Exam  Constitutional: She is oriented to person, place, and time. She appears well-developed and well-nourished. No distress.  HENT:  Head: Normocephalic and atraumatic.  Right Ear: External ear normal.  Left Ear: External ear normal.  Eyes: Pupils are equal, round, and reactive to light. Conjunctivae and EOM are normal.  Cardiovascular: Normal rate.   Pulmonary/Chest: Effort normal. No respiratory distress.  Abdominal: Soft. Normal appearance and  bowel sounds are normal. There is tenderness in the suprapubic area.  Neurological: She is alert and oriented to person, place, and time.  Skin: She is not diaphoretic.  Psychiatric: She has a normal mood and affect. Her behavior is normal.     Assessment and Plan: Emily Lara is a 51 y.o. female who is here today for cc of dysuria Acute cystitis with hematuria - Plan: ciprofloxacin (CIPRO) 250 MG tablet  Dysuria - Plan: POCT urinalysis dipstick, POCT glucose (manual entry), Hemoglobin A1c, Urinalysis, microscopic only, Urine Culture, CANCELED: POCT Microscopic Urinalysis ([facility])  Glucosuria - Plan: POCT glucose (manual entry), Hemoglobin A1c, CANCELED: POCT Microscopic Urinalysis ([facility])  Ivar Drape, PA-C [facility] 9/2/20188:30 AM

## 2016-10-11 NOTE — Patient Instructions (Addendum)
   Urinary Tract Infection, Adult A urinary tract infection (UTI) is an infection of any part of the urinary tract. The urinary tract includes the:  Kidneys.  Ureters.  Bladder.  Urethra.  These organs make, store, and get rid of pee (urine) in the body. Follow these instructions at home:  Take over-the-counter and prescription medicines only as told by your doctor.  If you were prescribed an antibiotic medicine, take it as told by your doctor. Do not stop taking the antibiotic even if you start to feel better.  Avoid the following drinks: ? Alcohol. ? Caffeine. ? Tea. ? Carbonated drinks.  Drink enough fluid to keep your pee clear or pale yellow.  Keep all follow-up visits as told by your doctor. This is important.  Make sure to: ? Empty your bladder often and completely. Do not to hold pee for long periods of time. ? Empty your bladder before and after sex. ? Wipe from front to back after a bowel movement if you are female. Use each tissue one time when you wipe. Contact a doctor if:  You have back pain.  You have a fever.  You feel sick to your stomach (nauseous).  You throw up (vomit).  Your symptoms do not get better after 3 days.  Your symptoms go away and then come back. Get help right away if:  You have very bad back pain.  You have very bad lower belly (abdominal) pain.  You are throwing up and cannot keep down any medicines or water. This information is not intended to replace advice given to you by your health care provider. Make sure you discuss any questions you have with your health care provider. Document Released: 07/21/2007 Document Revised: 07/10/2015 Document Reviewed: 12/23/2014 Elsevier Interactive Patient Education  2018 Elsevier Inc.    IF you received an x-ray today, you will receive an invoice from [facility]. Please contact [facility] at [phone] with questions or concerns regarding your invoice.   IF you  received labwork today, you will receive an invoice from [facility]. Please contact [facility] at [phone] with questions or concerns regarding your invoice.   Our billing staff will not be able to assist you with questions regarding bills from these companies.  You will be contacted with the lab results as soon as they are available. The fastest way to get your results is to activate your My Chart account. Instructions are located on the last page of this paperwork. If you have not heard from us regarding the results in 2 weeks, please contact this office.     

## 2016-10-12 LAB — HEMOGLOBIN A1C
ESTIMATED AVERAGE GLUCOSE: 117 mg/dL
HEMOGLOBIN A1C: 5.7 % — AB (ref 4.8–5.6)

## 2016-10-12 LAB — URINALYSIS, MICROSCOPIC ONLY: Casts: NONE SEEN /lpf

## 2016-10-13 LAB — URINE CULTURE

## 2016-11-05 ENCOUNTER — Other Ambulatory Visit: Payer: Self-pay | Admitting: Gynecology

## 2016-11-05 DIAGNOSIS — Z1231 Encounter for screening mammogram for malignant neoplasm of breast: Secondary | ICD-10-CM

## 2016-12-09 ENCOUNTER — Ambulatory Visit
Admission: RE | Admit: 2016-12-09 | Discharge: 2016-12-09 | Disposition: A | Discharge status: Home / Self Care | Payer: BLUE CROSS/BLUE SHIELD | Source: Ambulatory Visit | Attending: Gynecology | Admitting: Gynecology

## 2016-12-09 DIAGNOSIS — Z1231 Encounter for screening mammogram for malignant neoplasm of breast: Secondary | ICD-10-CM

## 2017-01-27 ENCOUNTER — Ambulatory Visit (INDEPENDENT_AMBULATORY_CARE_PROVIDER_SITE_OTHER): Payer: BLUE CROSS/BLUE SHIELD | Admitting: Gynecology

## 2017-01-27 ENCOUNTER — Encounter: Payer: Self-pay | Admitting: Gynecology

## 2017-01-27 VITALS — BP 120/74 | Ht 59.0 in | Wt 140.0 lb

## 2017-01-27 DIAGNOSIS — Z01419 Encounter for gynecological examination (general) (routine) without abnormal findings: Secondary | ICD-10-CM | POA: Diagnosis not present

## 2017-01-27 NOTE — Patient Instructions (Signed)
Follow up in one year for annual exam 

## 2017-01-27 NOTE — Progress Notes (Signed)
    Emily Lara [birth year] [id number]        51 y.o.  [id number] for annual gynecologic exam.   Past medical history,surgical history, problem list, medications, allergies, family history and social history were all reviewed and documented as reviewed in the EPIC chart.  ROS:  Performed with pertinent positives and negatives included in the history, assessment and plan.   Additional significant findings : None   Exam: Caryn Bee assistant Vitals:   01/27/17 1411  BP: 120/74  Weight: 140 lb (63.5 kg)  Height: 4\' 11"  (1.499 m)   Body mass index is 28.28 kg/m.  General appearance:  Normal affect, orientation and appearance. Skin: Grossly normal HEENT: Without gross lesions.  No cervical or supraclavicular adenopathy. Thyroid normal.  Lungs:  Clear without wheezing, rales or rhonchi Cardiac: RR, without RMG Abdominal:  Soft, nontender, without masses, guarding, rebound, organomegaly or hernia Breasts:  Examined lying and sitting without masses, retractions, discharge or axillary adenopathy. Pelvic:  Ext, BUS, Vagina: Normal  Cervix: Normal  Uterus: Anteverted, normal size, shape and contour, midline and mobile nontender   Adnexa: Without masses or tenderness    Anus and perineum: Normal   Rectovaginal: Normal sphincter tone without palpated masses or tenderness.    Assessment/Plan:  51 y.o. [name] female for annual gynecologic exam.   1. Postmenopausal.  Without menses over the past year.  Elevated FSH last year.  No significant hot flushes or night sweats.  Continue to monitor and report any issues or bleeding. 2. Mammography 11/2016.  Continue with annual mammography next year.  Breast exam normal today.  SBE monthly reviewed. 3. Pap smear/HPV 01/2013.  No Pap smear done today.  No history of abnormal Pap smears.  Plan repeat Pap smear next year at 5-year interval per current screening guidelines. 4. Health maintenance.  No routine lab work done as patient does this  elsewhere.  Follow-up in 1 year, sooner as needed.   Anastasio Auerbach MD, 2:34 PM 01/27/2017

## 2017-05-17 ENCOUNTER — Encounter: Payer: Self-pay | Admitting: Physician Assistant

## 2017-07-13 ENCOUNTER — Ambulatory Visit (INDEPENDENT_AMBULATORY_CARE_PROVIDER_SITE_OTHER): Payer: Worker's Compensation | Admitting: Emergency Medicine

## 2017-07-13 ENCOUNTER — Encounter: Payer: Self-pay | Admitting: Emergency Medicine

## 2017-07-13 ENCOUNTER — Other Ambulatory Visit: Payer: Self-pay

## 2017-07-13 VITALS — BP 114/80 | HR 68 | Temp 97.5°F | Resp 16 | Ht 59.25 in | Wt 146.6 lb

## 2017-07-13 DIAGNOSIS — T2220XA Burn of second degree of shoulder and upper limb, except wrist and hand, unspecified site, initial encounter: Secondary | ICD-10-CM

## 2017-07-13 DIAGNOSIS — T2010XA Burn of first degree of head, face, and neck, unspecified site, initial encounter: Secondary | ICD-10-CM

## 2017-07-13 DIAGNOSIS — T2017XA Burn of first degree of neck, initial encounter: Secondary | ICD-10-CM | POA: Diagnosis not present
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
## 2017-07-13 NOTE — Patient Instructions (Addendum)
IF you received an x-ray today, you will receive an invoice from [facility]. Please contact [facility] at [phone] with questions or concerns regarding your invoice.   IF you received labwork today, you will receive an invoice from [facility]. Please contact [facility] at [phone] with questions or concerns regarding your invoice.   Our billing staff will not be able to assist you with questions regarding bills from these companies.  You will be contacted with the lab results as soon as they are available. The fastest way to get your results is to activate your My Chart account. Instructions are located on the last page of this paperwork. If you have not heard from [place] regarding the results in 2 weeks, please contact this office.     Burn Care, Adult A burn is an injury to the skin or the tissues under the skin. There are three types of burns:  First degree. These burns may cause the skin to be red and a bit swollen.  Second degree. These burns are very painful and cause the skin to be very red. The skin may also leak fluid, look shiny, and start to have blisters.  Third degree. These burns cause permanent damage. They turn the skin white or black and make it look charred, dry, and leathery.  Taking care of your burn properly can help to prevent pain and infection. It can also help the burn to heal more quickly. How is this treated? Right after a burn:  Rinse or soak the burn under cool water. Do this for several minutes. Do not put ice on your burn. That can cause more damage.  Lightly cover the burn with a clean (sterile) cloth (dressing). Burn care  Raise (elevate) the injured area above the level of your heart while sitting or lying down.  Follow instructions from your doctor about: ? How to clean and take care of the burn. ? When to change and remove the cloth.  Check your burn every day for signs of infection. Check for: ? More redness,  swelling, or pain. ? Warmth. ? Pus or a bad smell. Medicine   Take over-the-counter and prescription medicines only as told by your doctor.  If you were prescribed antibiotic medicine, take or apply it as told by your doctor. Do not stop using the antibiotic even if your condition improves. General instructions  To prevent infection: ? Do not put butter, oil, or other home treatments on the burn. ? Do not scratch or pick at the burn. ? Do not break any blisters. ? Do not peel skin.  Do not rub your burn, even when you are cleaning it.  Protect your burn from the sun. Contact a doctor if:  Your condition does not get better.  Your condition gets worse.  You have a fever.  Your burn looks different or starts to have black or red spots on it.  Your burn feels warm to the touch.  Your pain is not controlled with medicine. Get help right away if:  You have redness, swelling, or pain at the site of the burn.  You have fluid, blood, or pus coming from your burn.  You have red streaks near the burn.  You have very bad pain. This information is not intended to replace advice given to you by your health care provider. Make sure you discuss any questions you have with your health care provider. Document Released: 11/11/2007 Document Revised: 03/20/2016 Document Reviewed: 07/22/2015 Elsevier Interactive  Patient Education  [employer].

## 2017-07-15 ENCOUNTER — Encounter: Payer: Self-pay | Admitting: Emergency Medicine

## 2017-07-15 ENCOUNTER — Other Ambulatory Visit: Payer: Self-pay

## 2017-07-15 ENCOUNTER — Ambulatory Visit (INDEPENDENT_AMBULATORY_CARE_PROVIDER_SITE_OTHER): Payer: Worker's Compensation | Admitting: Emergency Medicine

## 2017-07-15 VITALS — BP 112/80 | HR 84 | Temp 98.1°F | Resp 16 | Wt 145.4 lb

## 2017-07-15 DIAGNOSIS — T2017XD Burn of first degree of neck, subsequent encounter: Secondary | ICD-10-CM | POA: Diagnosis not present

## 2017-07-15 DIAGNOSIS — T2010XA Burn of first degree of head, face, and neck, unspecified site, initial encounter: Secondary | ICD-10-CM | POA: Insufficient documentation

## 2017-07-15 DIAGNOSIS — T22211D Burn of second degree of right forearm, subsequent encounter: Secondary | ICD-10-CM | POA: Diagnosis not present

## 2017-07-15 DIAGNOSIS — T2010XD Burn of first degree of head, face, and neck, unspecified site, subsequent encounter: Secondary | ICD-10-CM | POA: Diagnosis not present

## 2017-07-15 DIAGNOSIS — T2017XA Burn of first degree of neck, initial encounter: Secondary | ICD-10-CM | POA: Insufficient documentation

## 2017-07-15 DIAGNOSIS — T22211A Burn of second degree of right forearm, initial encounter: Secondary | ICD-10-CM | POA: Insufficient documentation

## 2017-07-15 MED ORDER — SILVER SULFADIAZINE 1 % EX CREA: 1.0000 "application " | TOPICAL_CREAM | Freq: Every day | CUTANEOUS | 1 refills | Status: DC
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
## 2017-07-15 NOTE — Patient Instructions (Addendum)
IF you received an x-ray today, you will receive an invoice from [facility]. Please contact [facility] at [phone] with questions or concerns regarding your invoice.   IF you received labwork today, you will receive an invoice from [facility]. Please contact [facility] at [phone] with questions or concerns regarding your invoice.   Our billing staff will not be able to assist you with questions regarding bills from these companies.  You will be contacted with the lab results as soon as they are available. The fastest way to get your results is to activate your My Chart account. Instructions are located on the last page of this paperwork. If you have not heard from [place] regarding the results in 2 weeks, please contact this office.     Burn Care, Adult A burn is an injury to the skin or the tissues under the skin. There are three types of burns:  First degree. These burns may cause the skin to be red and a bit swollen.  Second degree. These burns are very painful and cause the skin to be very red. The skin may also leak fluid, look shiny, and start to have blisters.  Third degree. These burns cause permanent damage. They turn the skin white or black and make it look charred, dry, and leathery.  Taking care of your burn properly can help to prevent pain and infection. It can also help the burn to heal more quickly. How is this treated? Right after a burn:  Rinse or soak the burn under cool water. Do this for several minutes. Do not put ice on your burn. That can cause more damage.  Lightly cover the burn with a clean (sterile) cloth (dressing). Burn care  Raise (elevate) the injured area above the level of your heart while sitting or lying down.  Follow instructions from your doctor about: ? How to clean and take care of the burn. ? When to change and remove the cloth.  Check your burn every day for signs of infection. Check for: ? More redness,  swelling, or pain. ? Warmth. ? Pus or a bad smell. Medicine   Take over-the-counter and prescription medicines only as told by your doctor.  If you were prescribed antibiotic medicine, take or apply it as told by your doctor. Do not stop using the antibiotic even if your condition improves. General instructions  To prevent infection: ? Do not put butter, oil, or other home treatments on the burn. ? Do not scratch or pick at the burn. ? Do not break any blisters. ? Do not peel skin.  Do not rub your burn, even when you are cleaning it.  Protect your burn from the sun. Contact a doctor if:  Your condition does not get better.  Your condition gets worse.  You have a fever.  Your burn looks different or starts to have black or red spots on it.  Your burn feels warm to the touch.  Your pain is not controlled with medicine. Get help right away if:  You have redness, swelling, or pain at the site of the burn.  You have fluid, blood, or pus coming from your burn.  You have red streaks near the burn.  You have very bad pain. This information is not intended to replace advice given to you by your health care provider. Make sure you discuss any questions you have with your health care provider. Document Released: 11/11/2007 Document Revised: 03/20/2016 Document Reviewed: 07/22/2015 Elsevier Interactive  Patient Education  [employer].

## 2017-07-19 ENCOUNTER — Ambulatory Visit (INDEPENDENT_AMBULATORY_CARE_PROVIDER_SITE_OTHER): Payer: Worker's Compensation | Admitting: Emergency Medicine

## 2017-07-19 ENCOUNTER — Encounter: Payer: Self-pay | Admitting: Emergency Medicine

## 2017-07-19 ENCOUNTER — Other Ambulatory Visit: Payer: Self-pay

## 2017-07-19 VITALS — BP 104/74 | HR 96 | Temp 97.2°F | Resp 16 | Wt 146.6 lb

## 2017-07-19 DIAGNOSIS — T2017XD Burn of first degree of neck, subsequent encounter: Secondary | ICD-10-CM | POA: Diagnosis not present

## 2017-07-19 DIAGNOSIS — T22211D Burn of second degree of right forearm, subsequent encounter: Secondary | ICD-10-CM | POA: Diagnosis not present

## 2017-07-19 DIAGNOSIS — T2010XD Burn of first degree of head, face, and neck, unspecified site, subsequent encounter: Secondary | ICD-10-CM

## 2017-07-19 MED ORDER — SILVER SULFADIAZINE 1 % EX CREA: 1.0000 "application " | TOPICAL_CREAM | Freq: Every day | CUTANEOUS | 1 refills | Status: DC

## 2017-07-19 NOTE — Patient Instructions (Addendum)
IF you received an x-ray today, you will receive an invoice from [facility]. Please contact [facility] at [phone] with questions or concerns regarding your invoice.   IF you received labwork today, you will receive an invoice from [facility]. Please contact [facility] at [phone] with questions or concerns regarding your invoice.   Our billing staff will not be able to assist you with questions regarding bills from these companies.  You will be contacted with the lab results as soon as they are available. The fastest way to get your results is to activate your My Chart account. Instructions are located on the last page of this paperwork. If you have not heard from [place] regarding the results in 2 weeks, please contact this office.     Burn Care, Adult A burn is an injury to the skin or the tissues under the skin. There are three types of burns:  First degree. These burns may cause the skin to be red and a bit swollen.  Second degree. These burns are very painful and cause the skin to be very red. The skin may also leak fluid, look shiny, and start to have blisters.  Third degree. These burns cause permanent damage. They turn the skin white or black and make it look charred, dry, and leathery.  Taking care of your burn properly can help to prevent pain and infection. It can also help the burn to heal more quickly. How is this treated? Right after a burn:  Rinse or soak the burn under cool water. Do this for several minutes. Do not put ice on your burn. That can cause more damage.  Lightly cover the burn with a clean (sterile) cloth (dressing). Burn care  Raise (elevate) the injured area above the level of your heart while sitting or lying down.  Follow instructions from your doctor about: ? How to clean and take care of the burn. ? When to change and remove the cloth.  Check your burn every day for signs of infection. Check for: ? More redness,  swelling, or pain. ? Warmth. ? Pus or a bad smell. Medicine   Take over-the-counter and prescription medicines only as told by your doctor.  If you were prescribed antibiotic medicine, take or apply it as told by your doctor. Do not stop using the antibiotic even if your condition improves. General instructions  To prevent infection: ? Do not put butter, oil, or other home treatments on the burn. ? Do not scratch or pick at the burn. ? Do not break any blisters. ? Do not peel skin.  Do not rub your burn, even when you are cleaning it.  Protect your burn from the sun. Contact a doctor if:  Your condition does not get better.  Your condition gets worse.  You have a fever.  Your burn looks different or starts to have black or red spots on it.  Your burn feels warm to the touch.  Your pain is not controlled with medicine. Get help right away if:  You have redness, swelling, or pain at the site of the burn.  You have fluid, blood, or pus coming from your burn.  You have red streaks near the burn.  You have very bad pain. This information is not intended to replace advice given to you by your health care provider. Make sure you discuss any questions you have with your health care provider. Document Released: 11/11/2007 Document Revised: 03/20/2016 Document Reviewed: 07/22/2015 Elsevier Interactive  Patient Education  [employer].
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
## 2017-07-25 ENCOUNTER — Ambulatory Visit: Payer: BLUE CROSS/BLUE SHIELD | Admitting: Urgent Care

## 2017-07-25 ENCOUNTER — Encounter: Payer: Self-pay | Admitting: Urgent Care

## 2017-07-25 VITALS — BP 140/90 | HR 91 | Temp 97.9°F | Resp 16 | Ht 59.25 in | Wt 146.5 lb

## 2017-07-25 DIAGNOSIS — J0100 Acute maxillary sinusitis, unspecified: Secondary | ICD-10-CM

## 2017-07-25 DIAGNOSIS — R070 Pain in throat: Secondary | ICD-10-CM | POA: Diagnosis not present

## 2017-07-25 DIAGNOSIS — R05 Cough: Secondary | ICD-10-CM | POA: Diagnosis not present

## 2017-07-25 DIAGNOSIS — R0981 Nasal congestion: Secondary | ICD-10-CM

## 2017-07-25 DIAGNOSIS — J3089 Other allergic rhinitis: Secondary | ICD-10-CM

## 2017-07-25 DIAGNOSIS — Z9109 Other allergy status, other than to drugs and biological substances: Secondary | ICD-10-CM | POA: Diagnosis not present

## 2017-07-25 DIAGNOSIS — R059 Cough, unspecified: Secondary | ICD-10-CM

## 2017-07-25 MED ORDER — METHYLPREDNISOLONE ACETATE 80 MG/ML IJ SUSP
80.0000 mg | Freq: Once | INTRAMUSCULAR | Status: AC
  Administered 2017-07-25: 80 mg via INTRAMUSCULAR

## 2017-07-25 MED ORDER — BENZONATATE 100 MG PO CAPS: 100.0000 mg | ORAL_CAPSULE | Freq: Three times a day (TID) | ORAL | 0 refills | Status: DC | PRN

## 2017-07-25 MED ORDER — AMOXICILLIN 500 MG PO CAPS: 500.0000 mg | ORAL_CAPSULE | Freq: Three times a day (TID) | ORAL | 0 refills | Status: DC

## 2017-07-25 MED ORDER — HYDROCOD POLST-CPM POLST ER 10-8 MG/5ML PO SUER: 5.0000 mL | Freq: Every evening | ORAL | 0 refills | Status: DC | PRN

## 2017-07-25 NOTE — Progress Notes (Signed)
    MRN: [id number] DOB: [birth year]  Subjective:   Emily Lara is a 52 y.o. female presenting for 4-day history of worsening productive cough that elicits head pain.  Patient is also had persistent sinus congestion, reports a history of severe allergies not controlled well with Zyrtec and Flonase daily.  She is now having sinus pain.  She has tried over-the-counter cough medications with minimal relief.  Denies fever, ear pain, chest pain, shortness of breath, wheezing, nausea, vomiting, belly pain, rashes.  Denies smoking cigarettes.  Emily Lara has a current medication list which includes the following prescription(s): silver sulfadiazine and vitamin d (ergocalciferol). Also has No Known Allergies.  Emily Lara  has a past medical history of Allergy. Also  has a past surgical history that includes Cesarean section (2007).  Objective:   Vitals: BP 140/90   Pulse 91   Temp 97.9 F (36.6 C) (Oral)   Resp 16   Ht 4' 11.25" (1.505 m)   Wt 146 lb 7.5 oz (66.4 kg)   LMP 10/31/2015   SpO2 97%   BMI 29.33 kg/m   Physical Exam  Constitutional: She is oriented to person, place, and time. She appears well-developed and well-nourished.  HENT:  Bilateral maxillary sinus tenderness.  Throat with thick streaks of postnasal drainage.  Eyes: Right eye exhibits no discharge. Left eye exhibits no discharge. No scleral icterus.  Neck: Normal range of motion. Neck supple.  Cardiovascular: Normal rate, regular rhythm and intact distal pulses. Exam reveals no gallop and no friction rub.  No murmur heard. Pulmonary/Chest: No respiratory distress. She has no wheezes. She has no rales.  Lymphadenopathy:    She has no cervical adenopathy.  Neurological: She is alert and oriented to person, place, and time.  Skin: Skin is warm and dry.  Psychiatric: She has a normal mood and affect.   Assessment and Plan :   Acute non-recurrent maxillary sinusitis  Cough - Plan: methylPREDNISolone acetate (DEPO-MEDROL)  injection 80 mg  Nasal congestion  Throat pain  Environmental allergies  Non-seasonal allergic rhinitis due to other allergic trigger - Plan: methylPREDNISolone acetate (DEPO-MEDROL) injection 80 mg  We will cover patient for sinusitis secondary to allergic rhinitis.  Patient is to start amoxicillin, use cough suppression medicines.  IM Depo-Medrol in clinic today.  Return to clinic precautions discussed.  Jaynee Eagles, PA-C [facility] [phone] 07/25/2017  5:10 PM

## 2017-07-25 NOTE — Patient Instructions (Addendum)
Weight 2 weeks before using your Flonase nasal spray again.  In the meantime keep using Zyrtec. Hydrate well with at least 2 liters (1 gallon) of water daily. For sore throat try using a honey-based tea. Use 3 teaspoons of honey with juice squeezed from half lemon. Place shaved pieces of ginger into 1/2-1 cup of water and warm over stove top. Then mix the ingredients and repeat every 4 hours as needed.    Vim xoang, Ng??i l?n Sinusitis, Adult Vim xoang l hi?n t??ng ?au nh?c v vim ? cc xoang m?i. Xoang la? nh??ng khoa?ng tr?ng nng trong x??ng quanh m??t quy? vi?. Cc xoang n??m ??:  Xung quanh m?t.  ?? gi??a tra?n.  Phi?a sau mu?i.  ?? x??ng go? ma? cu?a quy? vi?.  Xoang va? h?c mu?i co? n?p nh?n ch??a ch?t lo?ng ???c qua?nh (di?ch nh?y). Di?ch nh?y th???ng ti?t ra t?? xoang cu?a quy? vi?. Khi m mu?i cu?a quy? vi? bi? vim ho??c s?ng, di?ch nh?y co? th? bi? ke?t la?i ho??c t??c nn khng khi? khng th? ?i qua ca?c xoang cu?a quy? vi?. Ti?nh tra?ng na?y cho phe?p vi khu?n, vi ru?t va? n?m pha?t tri?n, ?i?u na?y d?n ??n nhi?m tru?ng. Vim xoang co? th? pha?t tri?n nhanh cho?ng va? ke?o da?i 7?10 nga?y (c?p ti?nh) ho??c h?n 12 tu?n (ma?n ti?nh). Vim xoang co? th? pha?t tri?n sau khi bi? ca?m la?nh. Nguyn nhn g gy ra? Ti?nh tra?ng na?y co? th? xa?y ra do b?t ky? nguyn nhn gi? la?m s?ng bn trong ca?c xoang ho??c ng?n khng cho d?ch nh?y ch?y ra, bao g?m:  D? ?ng.  Hen suy?n.  Nhi?m vi khu?n ho??c vi ru?t.  Ca?c x??ng co? hi?nh da?ng b?t th???ng gi??a ca?c h?c mu?i.  Kh?i u ?? mu?i co? ch??a di?ch nh?y (polip mu?i).  L? xoang he?p.  Ca?c ch?t gy  nhi?m, ch??ng ha?n nh? ho?a ch?t ho??c cc ch?t ki?ch thi?ch trong khng khi?[employer]  M?t di? v?t t??c trong mu?i.  Nhi?m n?m. V?n ?? ny hi?m khi x?y ra.  ?i?u g lm t?ng nguy c?? Nh?ng y?u t? sau c th? lm qu v? d? b? tnh tr?ng ny h?n:  Bi? di? ??ng ho??c hen suy?n.  G?n ?y b?  ca?m la?nh ho??c nhi?m trng ???ng h h?p.  Co? bi?n da?ng c?u trc ho??c t??c trong mu?i ho??c trong xoang.  C h? mi?n d?ch b? y?u.  B?i ho??c l??n nhi?u.  Du?ng qua? nhi?u thu?c xi?t mu?i.  Ht thu?c.  Cc d?u hi?u ho?c tri?u ch?ng l g? Cc tri?u ch?ng chnh cu?a ti?nh tra?ng na?y l ?au v c?m gic n?ng xung quanh cc xoang b? ?nh h??ng. Cc tri?u ch?ng khc bao g?m:  ?au r?ng hm trn.  ?au tai.  ?au ??u.  H?i th? hi.  Suy gi?m thnh gic v v? gic.  Ho co? th? nhi?u h?n va?o ban ?m.  M?t m?i.  S?t.  Ch?y di?ch ??c ?? mu?i quy? vi?. D?ch ch?y ra th??ng c mu xanh v c th? c m? (m?).  Nghe?t m?i ho?c sung huy?t.  S? mu?i pha sau. ?o? la? khi co? thm di?ch nh?y ti?ch tu? ?? ho?ng ho??c ?? thnh sau mu?i.  S?ng v ?m h?n ? cc xoang b? ?nh h??ng.  ?au h?ng.  Nh?y c?m v?i nh sng.  Ch?n ?on tnh tr?ng ny nh? th? no? Tnh tr?ng ny c th? ???c ch?n ?on d?a vo tri?u ch??ng, khai thc b?nh s? v khm th?c th?. ?? ti?m hi?u xem  ti?nh tra?ng na?y la? c?p ti?nh hay ma?n ti?nh, chuyn gia ch?m so?c s??c kho?e cu?a quy? vi? co? th?:  Nhi?n va?o mu?i quy? vi? xem co? d?u hi?u polip mu?i hay khng.  G vo cc xoang b? ?nh h??ng ?? ki?m tra d?u hi?u nhi?m trng.  Khm bn trong xoang c?a qu v? b?ng cch s? d?ng m?t thi?t b? t?o ?nh c g?n ?n ? ??u (n?i soi).  N?u chuyn gia ch?m so?c s??c kho?e cu?a quy? vi? nghi ng?? quy? vi? bi? vim xoang ma?n ti?nh, quy? vi? cu?ng co? th?:  ???c ki?m tra di? ??ng.  L?y m?u di?ch nh?y ?? mu?i (c?y di?ch nh?y mu?i) va? ki?m tra xem co? vi khu?n khng.  Cho ki?m tra m?u di?ch nh?y ?? xem b?nh vim xoang cu?a quy? vi? co? lin quan ??n di? ??ng hay khng.  N?u b?nh vim xoang cu?a quy? vi? khng ?a?p ??ng v??i ?i?u tri? va? no? ke?o da?i h?n 8 tu?n, quy? vi? co? th? ???c chu?p MRI ho??c CT ?? ki?m tra xoang cu?a quy? vi?. Vi?c chu?p na?y cu?ng giu?p xa?c ?i?nh nhi?m tru?ng cu?a quy?  vi? n?ng ??n m?c na?o. Trong ca?c tr???ng h??p hi?m g??p, quy? vi? co? th? c?n la?m sinh thi?t x??ng ?? loa?i tr? nh??ng loa?i b?nh xoang do nhi?m n?m nghim tro?ng h?n. Tnh tr?ng ny ???c ?i?u tr? nh? th? no? Vi?c ?i?u tri? vim xoang tu?y thu?c va?o nguyn nhn va? ti?nh tra?ng cu?a quy? vi? la? ma?n ti?nh hay c?p ti?nh. N?u vim xoang cu?a quy? vi? do vi ru?t gy ra, ca?c tri?u ch??ng cu?a quy? vi? se? t?? h?t trong vo?ng 10 nga?y. Quy? vi? cu?ng co? th? ????c cho du?ng thu?c ?? gia?m nhe? ca?c tri?u ch??ng, bao g?m:  Thu?c ch?ng sung huy?t mu?i t?i ch?. Thu?c na?y la?m ca?c h?c mu?i s?ng co la?i va? ?? cho di?ch nh?y ch?y ra kh?i ca?c xoang.  Thu?c khng histamine. Nh??ng thu?c na?y ng?n ti?nh tra?ng vim do di? ??ng gy nn. Vi?c na?y co? th? giu?p gia?m s?ng ?? mu?i va? xoang cu?a quy? vi?.  Cc thu?c corticosteroid xi?t mu?i t?i ch?. ?y la? thu?c xi?t mu?i la?m gia?m vim va? gi?m s?ng ?? mu?i va? xoang cu?a quy? vi?.  N???c mu?i sinh l r??a mu?i. Nh??ng lo?i n???c r??a na?y co? th? giu?p loa?i bo? di?ch nh?y ???c trong mu?i quy? vi?.  N?u ti?nh tra?ng cu?a quy? vi? do vi khu?n gy ra, quy? vi? se? ????c cho du?ng thu?c kha?ng sinh. N?u ti?nh tra?ng cu?a quy? vi? do n?m gy ra, quy? vi? se? ????c cho du?ng thu?c kha?ng n?m. Ph?u thu?t co? th? c?n thi?t ?? ?i?u tri? tnh tr?ng b?nh ly? lin quan, ch??ng ha?n nh? he?p h?c mu?i. Ph?u thu?t cu?ng co? th? c?n thi?t ?? c??t bo? polip. Tun th? nh?ng h??ng d?n ny ? nh: Thu?c  Ch? u?ng, dng ho?c bi thu?c khng k ??n v thu?c k ??n theo ch? d?n c?a chuyn gia ch?m [place] s?c kh?e. Nh??ng thu?c na?y bao g?m ca? thu?c xi?t mu?i.  N?u qu v? ???c k thu?c khng sinh, hy dng thu?c theo ch? d?n c?a chuyn gia ch?m [place] s?c kh?e. Khng d?ng u?ng thu?c khng sinh ngay c? khi qu v? b?t ??u c?m th?y ?? h?n. U?ng ?u? n???c va? la?m ?m  U?ng ?? n??c ?? n??c ti?u trong ho?c c mu vng nh?t.  Gi?? cho c? th? ?u? n???c se? giu?p la?m loa?ng di?ch nh?y.  S?? du?ng m?t ma?y ta?o s??ng mu?  la?m ?m ma?t ?? gi?? ?? ?m trong nha? quy? vi? ?? m??c trn 50%.  Hi?t h?i n???c trong 10-15 phu?t, 3-4 m?i nga?y ho??c theo chi? d?n cu?a chuyn gia ch?m so?c s??c kho?e. Quy? vi? c th? la?m vi?c na?y trong pho?ng t??m khi vo?i sen n???c no?ng ?ang cha?y.  Ha?n ch? ti?p xu?c v??i khng khi? la?nh ho??c kh. Ngh? ng?i  Ngh? ng?i cng nhi?u cng t?t.  Ngu? g?i ??u cao (nng cao).  Ba?o ?a?m ngu? ?u? gi?c va?o m?i ?m. H??ng d?n chung  Ch??m kh?n ?m, ?m ln m?t 3-4 l?n m?i ngy ho?c theo ch? d?n c?a chuyn gia ch?m [place] s?c kh?e. Vi?c na?y se? giu?p gia?m c?m gic kho? chi?u.  R??a tay th???ng xuyn b??ng xa? pho?ng va? n???c ?? gia?m ti?p xu?c v??i vi ru?t va? ca?c vi tru?ng kha?c. N?u khng c x phng v n??c, hy dng thu?c st trng tay.  Khng ht thu?c. Tra?nh ?? ca?nh nh??ng ng???i hu?t thu?c (kho?i thu?c thu? ??ng).  Tun th? t?t c? cc l?n khm theo di theo ch? d?n c?a chuyn gia ch?m [place] s?c kh?e. ?i?u ny c vai tr quan tr?ng. Hy lin l?c v?i chuyn gia ch?m [place] s?c kh?e n?u:  Qu v? b? s?t.  Tri?u ch?ng c?a qu v? tr?m tr?ng h?n.  Tri?u ch?ng c?a qu v? khng c?i thi?n trong 10 ngy. Yu c?u tr? gip ngay l?p t?c n?u:  Qu v? b? ?au ??u nhi?u.  Qu v? lin t?c nn m?a.  Qu v? b? ?au ho??c s?ng xung quanh m?t ho??c m??t.  Qu v? c v?n ?? v? th? l?c.  Qu v? b? l l?n.  C? qu v? b? c?ng.  Qu v? b? kh th?. Thng tin ny khng nh?m m?c ?ch thay th? cho l?i khuyn m chuyn gia ch?m [place] s?c kh?e ni v?i qu v?. Hy b?o ??m qu v? ph?i th?o lu?n b?t k? v?n ?? g m qu v? c v?i chuyn gia ch?m  s?c kh?e c?a qu v?. Document Released: 08/03/2011 Document Revised: 05/20/2016 Document Reviewed: 11/27/2014 Elsevier Interactive Patient Education  2018 [place].     Sinusitis, Adult Sinusitis is soreness and inflammation of your  sinuses. Sinuses are hollow spaces in the bones around your face. Your sinuses are located:  Around your eyes.  In the middle of your forehead.  Behind your nose.  In your cheekbones.  Your sinuses and nasal passages are lined with a stringy fluid (mucus). Mucus normally drains out of your sinuses. When your nasal tissues become inflamed or swollen, the mucus can become trapped or blocked so air cannot flow through your sinuses. This allows bacteria, viruses, and funguses to grow, which leads to infection. Sinusitis can develop quickly and last for 7?10 days (acute) or for more than 12 weeks (chronic). Sinusitis often develops after a cold. What are the causes? This condition is caused by anything that creates swelling in the sinuses or stops mucus from draining, including:  Allergies.  Asthma.  Bacterial or viral infection.  Abnormally shaped bones between the nasal passages.  Nasal growths that contain mucus (nasal polyps).  Narrow sinus openings.  Pollutants, such as chemicals or irritants in the air.  A foreign object stuck in the nose.  A fungal infection. This is rare.  What increases the risk? The following factors may make you more likely to develop this condition:  Having allergies or asthma.  Having had a recent cold or respiratory tract infection.  Having  structural deformities or blockages in your nose or sinuses.  Having a weak immune system.  Doing a lot of swimming or diving.  Overusing nasal sprays.  Smoking.  What are the signs or symptoms? The main symptoms of this condition are pain and a feeling of pressure around the affected sinuses. Other symptoms include:  Upper toothache.  Earache.  Headache.  Bad breath.  Decreased sense of smell and taste.  A cough that may get worse at night.  Fatigue.  Fever.  Thick drainage from your nose. The drainage is often green and it may contain pus (purulent).  Stuffy nose or  congestion.  Postnasal drip. This is when extra mucus collects in the throat or back of the nose.  Swelling and warmth over the affected sinuses.  Sore throat.  Sensitivity to light.  How is this diagnosed? This condition is diagnosed based on symptoms, a medical history, and a physical exam. To find out if your condition is acute or chronic, your health care provider may:  Look in your nose for signs of nasal polyps.  Tap over the affected sinus to check for signs of infection.  View the inside of your sinuses using an imaging device that has a light attached (endoscope).  If your health care provider suspects that you have chronic sinusitis, you may also:  Be tested for allergies.  Have a sample of mucus taken from your nose (nasal culture) and checked for bacteria.  Have a mucus sample examined to see if your sinusitis is related to an allergy.  If your sinusitis does not respond to treatment and it lasts longer than 8 weeks, you may have an MRI or CT scan to check your sinuses. These scans also help to determine how severe your infection is. In rare cases, a bone biopsy may be done to rule out more serious types of fungal sinus disease. How is this treated? Treatment for sinusitis depends on the cause and whether your condition is chronic or acute. If a virus is causing your sinusitis, your symptoms will go away on their own within 10 days. You may be given medicines to relieve your symptoms, including:  Topical nasal decongestants. They shrink swollen nasal passages and let mucus drain from your sinuses.  Antihistamines. These drugs block inflammation that is triggered by allergies. This can help to ease swelling in your nose and sinuses.  Topical nasal corticosteroids. These are nasal sprays that ease inflammation and swelling in your nose and sinuses.  Nasal saline washes. These rinses can help to get rid of thick mucus in your nose.  If your condition is caused by  bacteria, you will be given an antibiotic medicine. If your condition is caused by a fungus, you will be given an antifungal medicine. Surgery may be needed to correct underlying conditions, such as narrow nasal passages. Surgery may also be needed to remove polyps. Follow these instructions at home: Medicines  Take, use, or apply over-the-counter and prescription medicines only as told by your health care provider. These may include nasal sprays.  If you were prescribed an antibiotic medicine, take it as told by your health care provider. Do not stop taking the antibiotic even if you start to feel better. Hydrate and Humidify  Drink enough water to keep your urine clear or pale yellow. Staying hydrated will help to thin your mucus.  Use a cool mist humidifier to keep the humidity level in your home above 50%.  Inhale steam for 10-15 minutes, 3-4  times a day or as told by your health care provider. You can do this in the bathroom while a hot shower is running.  Limit your exposure to cool or dry air. Rest  Rest as much as possible.  Sleep with your head raised (elevated).  Make sure to get enough sleep each night. General instructions  Apply a warm, moist washcloth to your face 3-4 times a day or as told by your health care provider. This will help with discomfort.  Wash your hands often with soap and water to reduce your exposure to viruses and other germs. If soap and water are not available, use hand sanitizer.  Do not smoke. Avoid being around people who are smoking (secondhand smoke).  Keep all follow-up visits as told by your health care provider. This is important. Contact a health care provider if:  You have a fever.  Your symptoms get worse.  Your symptoms do not improve within 10 days. Get help right away if:  You have a severe headache.  You have persistent vomiting.  You have pain or swelling around your face or eyes.  You have vision problems.  You  develop confusion.  Your neck is stiff.  You have trouble breathing. This information is not intended to replace advice given to you by your health care provider. Make sure you discuss any questions you have with your health care provider. Document Released: 02/01/2005 Document Revised: 09/28/2015 Document Reviewed: 11/27/2014 Elsevier Interactive Patient Education  2018 [employer].     IF you received an x-ray today, you will receive an invoice from [facility]. Please contact [facility] at [phone] with questions or concerns regarding your invoice.   IF you received labwork today, you will receive an invoice from [facility]. Please contact [facility] at [phone] with questions or concerns regarding your invoice.   Our billing staff will not be able to assist you with questions regarding bills from these companies.  You will be contacted with the lab results as soon as they are available. The fastest way to get your results is to activate your My Chart account. Instructions are located on the last page of this paperwork. If you have not heard from [place] regarding the results in 2 weeks, please contact this office.

## 2017-07-26 ENCOUNTER — Encounter: Payer: Self-pay | Admitting: Emergency Medicine

## 2017-07-26 ENCOUNTER — Ambulatory Visit (INDEPENDENT_AMBULATORY_CARE_PROVIDER_SITE_OTHER): Payer: Worker's Compensation | Admitting: Emergency Medicine

## 2017-07-26 VITALS — BP 114/79 | HR 85 | Temp 98.8°F | Resp 17 | Ht 60.0 in | Wt 149.0 lb

## 2017-07-26 DIAGNOSIS — T2017XD Burn of first degree of neck, subsequent encounter: Secondary | ICD-10-CM

## 2017-07-26 DIAGNOSIS — T2010XD Burn of first degree of head, face, and neck, unspecified site, subsequent encounter: Secondary | ICD-10-CM | POA: Diagnosis not present

## 2017-07-26 DIAGNOSIS — T22211D Burn of second degree of right forearm, subsequent encounter: Secondary | ICD-10-CM

## 2017-07-26 NOTE — Patient Instructions (Addendum)
   IF you received an x-ray today, you will receive an invoice from [facility]. Please contact [facility] at [phone] with questions or concerns regarding your invoice.   IF you received labwork today, you will receive an invoice from [facility]. Please contact [facility] at [phone] with questions or concerns regarding your invoice.   Our billing staff will not be able to assist you with questions regarding bills from these companies.  You will be contacted with the lab results as soon as they are available. The fastest way to get your results is to activate your My Chart account. Instructions are located on the last page of this paperwork. If you have not heard from us regarding the results in 2 weeks, please contact this office.     Health Maintenance, Female Adopting a healthy lifestyle and getting preventive care can go a long way to promote health and wellness. Talk with your health care provider about what schedule of regular examinations is right for you. This is a good chance for you to check in with your provider about disease prevention and staying healthy. In between checkups, there are plenty of things you can do on your own. Experts have done a lot of research about which lifestyle changes and preventive measures are most likely to keep you healthy. Ask your health care provider for more information. Weight and diet Eat a healthy diet  Be sure to include plenty of vegetables, fruits, low-fat dairy products, and lean protein.  Do not eat a lot of foods high in solid fats, added sugars, or salt.  Get regular exercise. This is one of the most important things you can do for your health. ? Most adults should exercise for at least 150 minutes each week. The exercise should increase your heart rate and make you sweat (moderate-intensity exercise). ? Most adults should also do strengthening exercises at least twice a week. This is in addition to the  moderate-intensity exercise.  Maintain a healthy weight  Body mass index (BMI) is a measurement that can be used to identify possible weight problems. It estimates body fat based on height and weight. Your health care provider can help determine your BMI and help you achieve or maintain a healthy weight.  For females 20 years of age and older: ? A BMI below 18.5 is considered underweight. ? A BMI of 18.5 to 24.9 is normal. ? A BMI of 25 to 29.9 is considered overweight. ? A BMI of 30 and above is considered obese.  Watch levels of cholesterol and blood lipids  You should start having your blood tested for lipids and cholesterol at 50 years of age, then have this test every 5 years.  You may need to have your cholesterol levels checked more often if: ? Your lipid or cholesterol levels are high. ? You are older than 50 years of age. ? You are at high risk for heart disease.  Cancer screening Lung Cancer  Lung cancer screening is recommended for adults 55-80 years old who are at high risk for lung cancer because of a history of smoking.  A yearly low-dose CT scan of the lungs is recommended for people who: ? Currently smoke. ? Have quit within the past 15 years. ? Have at least a 30-pack-year history of smoking. A pack year is smoking an average of one pack of cigarettes a day for 1 year.  Yearly screening should continue until it has been 15 years since you quit.  Yearly   screening should stop if you develop a health problem that would prevent you from having lung cancer treatment.  Breast Cancer  Practice breast self-awareness. This means understanding how your breasts normally appear and feel.  It also means doing regular breast self-exams. Let your health care provider know about any changes, no matter how small.  If you are in your 20s or 30s, you should have a clinical breast exam (CBE) by a health care provider every 1-3 years as part of a regular health exam.  If you  are 73 or older, have a CBE every year. Also consider having a breast X-ray (mammogram) every year.  If you have a family history of breast cancer, talk to your health care provider about genetic screening.  If you are at high risk for breast cancer, talk to your health care provider about having an MRI and a mammogram every year.  Breast cancer gene (BRCA) assessment is recommended for women who have family members with BRCA-related cancers. BRCA-related cancers include: ? Breast. ? Ovarian. ? Tubal. ? Peritoneal cancers.  Results of the assessment will determine the need for genetic counseling and BRCA1 and BRCA2 testing.  Cervical Cancer Your health care provider may recommend that you be screened regularly for cancer of the pelvic organs (ovaries, uterus, and vagina). This screening involves a pelvic examination, including checking for microscopic changes to the surface of your cervix (Pap test). You may be encouraged to have this screening done every 3 years, beginning at age 61.  For women ages 90-65, health care providers may recommend pelvic exams and Pap testing every 3 years, or they may recommend the Pap and pelvic exam, combined with testing for human papilloma virus (HPV), every 5 years. Some types of HPV increase your risk of cervical cancer. Testing for HPV may also be done on women of any age with unclear Pap test results.  Other health care providers may not recommend any screening for nonpregnant women who are considered low risk for pelvic cancer and who do not have symptoms. Ask your health care provider if a screening pelvic exam is right for you.  If you have had past treatment for cervical cancer or a condition that could lead to cancer, you need Pap tests and screening for cancer for at least 20 years after your treatment. If Pap tests have been discontinued, your risk factors (such as having a new sexual partner) need to be reassessed to determine if screening should  resume. Some women have medical problems that increase the chance of getting cervical cancer. In these cases, your health care provider may recommend more frequent screening and Pap tests.  Colorectal Cancer  This type of cancer can be detected and often prevented.  Routine colorectal cancer screening usually begins at 50 years of age and continues through 50 years of age.  Your health care provider may recommend screening at an earlier age if you have risk factors for colon cancer.  Your health care provider may also recommend using home test kits to check for hidden blood in the stool.  A small camera at the end of a tube can be used to examine your colon directly (sigmoidoscopy or colonoscopy). This is done to check for the earliest forms of colorectal cancer.  Routine screening usually begins at age 67.  Direct examination of the colon should be repeated every 5-10 years through 50 years of age. However, you may need to be screened more often if early forms of precancerous polyps  or small growths are found.  Skin Cancer  Check your skin from head to toe regularly.  Tell your health care provider about any new moles or changes in moles, especially if there is a change in a mole's shape or color.  Also tell your health care provider if you have a mole that is larger than the size of a pencil eraser.  Always use sunscreen. Apply sunscreen liberally and repeatedly throughout the day.  Protect yourself by wearing long sleeves, pants, a wide-brimmed hat, and sunglasses whenever you are outside.  Heart disease, diabetes, and high blood pressure  High blood pressure causes heart disease and increases the risk of stroke. High blood pressure is more likely to develop in: ? People who have blood pressure in the high end of the normal range (130-139/85-89 mm Hg). ? People who are overweight or obese. ? People who are African American.  If you are 18-39 years of age, have your blood  pressure checked every 3-5 years. If you are 40 years of age or older, have your blood pressure checked every year. You should have your blood pressure measured twice-once when you are at a hospital or clinic, and once when you are not at a hospital or clinic. Record the average of the two measurements. To check your blood pressure when you are not at a hospital or clinic, you can use: ? An automated blood pressure machine at a pharmacy. ? A home blood pressure monitor.  If you are between 55 years and 79 years old, ask your health care provider if you should take aspirin to prevent strokes.  Have regular diabetes screenings. This involves taking a blood sample to check your fasting blood sugar level. ? If you are at a normal weight and have a low risk for diabetes, have this test once every three years after 50 years of age. ? If you are overweight and have a high risk for diabetes, consider being tested at a younger age or more often. Preventing infection Hepatitis B  If you have a higher risk for hepatitis B, you should be screened for this virus. You are considered at high risk for hepatitis B if: ? You were born in a country where hepatitis B is common. Ask your health care provider which countries are considered high risk. ? Your parents were born in a high-risk country, and you have not been immunized against hepatitis B (hepatitis B vaccine). ? You have HIV or AIDS. ? You use needles to inject street drugs. ? You live with someone who has hepatitis B. ? You have had sex with someone who has hepatitis B. ? You get hemodialysis treatment. ? You take certain medicines for conditions, including cancer, organ transplantation, and autoimmune conditions.  Hepatitis C  Blood testing is recommended for: ? Everyone born from 1945 through 1965. ? Anyone with known risk factors for hepatitis C.  Sexually transmitted infections (STIs)  You should be screened for sexually transmitted  infections (STIs) including gonorrhea and chlamydia if: ? You are sexually active and are younger than 50 years of age. ? You are older than 50 years of age and your health care provider tells you that you are at risk for this type of infection. ? Your sexual activity has changed since you were last screened and you are at an increased risk for chlamydia or gonorrhea. Ask your health care provider if you are at risk.  If you do not have HIV, but are at risk,   it may be recommended that you take a prescription medicine daily to prevent HIV infection. This is called pre-exposure prophylaxis (PrEP). You are considered at risk if: ? You are sexually active and do not regularly use condoms or know the HIV status of your partner(s). ? You take drugs by injection. ? You are sexually active with a partner who has HIV.  Talk with your health care provider about whether you are at high risk of being infected with HIV. If you choose to begin PrEP, you should first be tested for HIV. You should then be tested every 3 months for as long as you are taking PrEP. Pregnancy  If you are premenopausal and you may become pregnant, ask your health care provider about preconception counseling.  If you may become pregnant, take 400 to 800 micrograms (mcg) of folic acid every day.  If you want to prevent pregnancy, talk to your health care provider about birth control (contraception). Osteoporosis and menopause  Osteoporosis is a disease in which the bones lose minerals and strength with aging. This can result in serious bone fractures. Your risk for osteoporosis can be identified using a bone density scan.  If you are 34 years of age or older, or if you are at risk for osteoporosis and fractures, ask your health care provider if you should be screened.  Ask your health care provider whether you should take a calcium or vitamin D supplement to lower your risk for osteoporosis.  Menopause may have certain physical  symptoms and risks.  Hormone replacement therapy may reduce some of these symptoms and risks. Talk to your health care provider about whether hormone replacement therapy is right for you. Follow these instructions at home:  Schedule regular health, dental, and eye exams.  Stay current with your immunizations.  Do not use any tobacco products including cigarettes, chewing tobacco, or electronic cigarettes.  If you are pregnant, do not drink alcohol.  If you are breastfeeding, limit how much and how often you drink alcohol.  Limit alcohol intake to no more than 1 drink per day for nonpregnant women. One drink equals 12 ounces of beer, 5 ounces of wine, or 1 ounces of hard liquor.  Do not use street drugs.  Do not share needles.  Ask your health care provider for help if you need support or information about quitting drugs.  Tell your health care provider if you often feel depressed.  Tell your health care provider if you have ever been abused or do not feel safe at home. This information is not intended to replace advice given to you by your health care provider. Make sure you discuss any questions you have with your health care provider. Document Released: 08/17/2010 Document Revised: 07/10/2015 Document Reviewed: 11/05/2014 [employer]vier Interactive Patient Education  [employer].
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
## 2017-07-30 ENCOUNTER — Telehealth: Payer: Self-pay | Admitting: Emergency Medicine

## 2017-07-30 NOTE — Telephone Encounter (Signed)
Copied from [place] [id number]. Topic: General - Other >> Jul 29, 2017 11:10 AM Lennox Solders wrote: Reason for CRM: pt is calling and her employer lost her work notes. Pt needs work notes for each date she saw provider 5-29, 5-31,6-4, 6-10, 6-11

## 2017-08-01 NOTE — Telephone Encounter (Signed)
Reprinted letters left at [address]  Tried calling patient mail box is full.

## 2017-11-22 ENCOUNTER — Ambulatory Visit: Payer: BLUE CROSS/BLUE SHIELD | Admitting: Family Medicine

## 2017-11-22 ENCOUNTER — Other Ambulatory Visit: Payer: Self-pay

## 2017-11-22 ENCOUNTER — Encounter: Payer: Self-pay | Admitting: Family Medicine

## 2017-11-22 VITALS — BP 120/78 | HR 77 | Temp 97.9°F | Ht 60.0 in | Wt 146.0 lb

## 2017-11-22 DIAGNOSIS — R05 Cough: Secondary | ICD-10-CM

## 2017-11-22 DIAGNOSIS — R062 Wheezing: Secondary | ICD-10-CM | POA: Diagnosis not present

## 2017-11-22 DIAGNOSIS — J069 Acute upper respiratory infection, unspecified: Secondary | ICD-10-CM

## 2017-11-22 DIAGNOSIS — R059 Cough, unspecified: Secondary | ICD-10-CM

## 2017-11-22 MED ORDER — ALBUTEROL SULFATE HFA 108 (90 BASE) MCG/ACT IN AERS: 1.0000 | INHALATION_SPRAY | RESPIRATORY_TRACT | 0 refills | Status: DC | PRN

## 2017-11-22 MED ORDER — HYDROCODONE-HOMATROPINE 5-1.5 MG/5ML PO SYRP: ORAL_SOLUTION | ORAL | 0 refills | Status: DC
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
## 2017-11-22 NOTE — Patient Instructions (Addendum)
Current symptoms appear to be due to a virus.  Make sure to get plenty of rest and drink sufficient fluids, water is best.  I expect symptoms to be improving later this week.  If you have fevers, worsening shortness of breath, or other worsening symptoms, please return for recheck.  For cough, can try Mucinex or Mucinex DM during the day, prescription cough syrup at night if needed.  Do not use the cough syrup at night if you are wheezing or short of breath, use albuterol instead for those symptoms.  Although some of the wheezing may be congestion from the infection, bronchospasm may also be possible. Albuterol can be used up to every 4 hours as needed for wheezing or shortness of breath, but if you require that medication more than once or twice per day, or persistently need that medication for more than the next few days, please return for recheck.  Return to the clinic or go to the nearest emergency room if any of your symptoms worsen or new symptoms occur.   Bronchospasm, Adult Bronchospasm is a tightening of the airways going into the lungs. During an episode, it may be harder to breathe. You may cough, and you may make a whistling sound when you breathe (wheeze). This condition often affects people with asthma. What are the causes? This condition is caused by swelling and irritation in the airways. It can be triggered by:  An infection (common).  Seasonal allergies.  An allergic reaction.  Exercise.  Irritants. These include pollution, cigarette smoke, strong odors, aerosol sprays, and paint fumes.  Weather changes. Winds increase molds and pollens in the air. Cold air may cause swelling.  Stress and emotional upset.  What are the signs or symptoms? Symptoms of this condition include:  Wheezing. If the episode was triggered by an allergy, wheezing may start right away or hours later.  Nighttime coughing.  Frequent or severe coughing with a simple cold.  Chest  tightness.  Shortness of breath.  Decreased ability to exercise.  How is this diagnosed? This condition is usually diagnosed with a review of your medical history and a physical exam. Tests, such as lung function tests, are sometimes done to look for other conditions. The need for a chest X-ray depends on where the wheezing occurs and whether it is the first time you have wheezed. How is this treated? This condition may be treated with:  Inhaled medicines. These open up the airways and help you breathe. They can be taken with an inhaler or a nebulizer device.  Corticosteroid medicines. These may be given for severe bronchospasm, usually when it is associated with asthma.  Avoiding triggers, such as irritants, infection, or allergies.  Follow these instructions at home: Medicines  Take over-the-counter and prescription medicines only as told by your health care provider.  If you need to use an inhaler or nebulizer to take your medicine, ask your health care provider to explain how to use it correctly. If you were given a spacer, always use it with your inhaler. Lifestyle  Reduce the number of triggers in your home. To do this: ? Change your heating and air conditioning filter at least once a month. ? Limit your use of fireplaces and wood stoves. ? Do not smoke. Do not allow smoking in your home. ? Avoid using perfumes and fragrances. ? Get rid of pests, such as roaches and mice, and their droppings. ? Remove any mold from your home. ? Keep your house clean and dust  free. Use unscented cleaning products. ? Replace carpet with wood, tile, or vinyl flooring. Carpet can trap dander and dust. ? Use allergy-proof pillows, mattress covers, and box spring covers. ? Wash bed sheets and blankets every week in hot water. Dry them in a dryer. ? Use blankets that are made of polyester or cotton. ? Wash your hands often. ? Do not allow pets in your bedroom.  Avoid breathing in cold air when  you exercise. General instructions  Have a plan for seeking medical care. Know when to call your health care provider and local emergency services, and where to get emergency care.  Stay up to date on your immunizations.  When you have an episode of bronchospasm, stay calm. Try to relax and breathe more slowly.  If you have asthma, make sure you have an asthma action plan.  Keep all follow-up visits as told by your health care provider. This is important. Contact a health care provider if:  You have muscle aches.  You have chest pain.  The mucus that you cough up (sputum) changes from clear or white to yellow, green, gray, or bloody.  You have a fever.  Your sputum gets thicker. Get help right away if:  Your wheezing and coughing get worse, even after you take your prescribed medicines.  It gets even harder to breathe.  You develop severe chest pain. Summary  Bronchospasm is a tightening of the airways going into the lungs.  During an episode of bronchospasm, you may have a harder time breathing. You may cough and make a whistling sound when you breathe (wheeze).  Avoid exposure to triggers such as smoke, dust, mold, animal dander, and fragrances.  When you have an episode of bronchospasm, stay calm. Try to relax and breathe more slowly. This information is not intended to replace advice given to you by your health care provider. Make sure you discuss any questions you have with your health care provider. Document Released: 02/04/2003 Document Revised: 01/29/2016 Document Reviewed: 01/29/2016 Elsevier Interactive Patient Education  2017 [place]  Upper Respiratory Infection, Adult Most upper respiratory infections (URIs) are caused by a virus. A URI affects the nose, throat, and upper air passages. The most common type of URI is often called "the common cold." Follow these instructions at home:  Take medicines only as told by your doctor.  Gargle warm saltwater  or take cough drops to comfort your throat as told by your doctor.  Use a warm mist humidifier or inhale steam from a shower to increase air moisture. This may make it easier to breathe.  Drink enough fluid to keep your pee (urine) clear or pale yellow.  Eat soups and other clear broths.  Have a healthy diet.  Rest as needed.  Go back to work when your fever is gone or your doctor says it is okay. ? You may need to stay home longer to avoid giving your URI to others. ? You can also wear a face mask and wash your hands often to prevent spread of the virus.  Use your inhaler more if you have asthma.  Do not use any tobacco products, including cigarettes, chewing tobacco, or electronic cigarettes. If you need help quitting, ask your doctor. Contact a doctor if:  You are getting worse, not better.  Your symptoms are not helped by medicine.  You have chills.  You are getting more short of breath.  You have brown or red mucus.  You have yellow or brown discharge  from your nose.  You have pain in your face, especially when you bend forward.  You have a fever.  You have puffy (swollen) neck glands.  You have pain while swallowing.  You have white areas in the back of your throat. Get help right away if:  You have very bad or constant: ? Headache. ? Ear pain. ? Pain in your forehead, behind your eyes, and over your cheekbones (sinus pain). ? Chest pain.  You have long-lasting (chronic) lung disease and any of the following: ? Wheezing. ? Long-lasting cough. ? Coughing up blood. ? A change in your usual mucus.  You have a stiff neck.  You have changes in your: ? Vision. ? Hearing. ? Thinking. ? Mood. This information is not intended to replace advice given to you by your health care provider. Make sure you discuss any questions you have with your health care provider. Document Released: 07/21/2007 Document Revised: 10/05/2015 Document Reviewed:  05/09/2013 Elsevier Interactive Patient Education  [employer].   If you have lab work done today you will be contacted with your lab results within the next 2 weeks.  If you have not heard from [place] then please contact us. The fastest way to get your results is to register for My Chart.   IF you received an x-ray today, you will receive an invoice from [facility]. Please contact [facility] at [phone] with questions or concerns regarding your invoice.   IF you received labwork today, you will receive an invoice from [facility]. Please contact [facility] at [phone] with questions or concerns regarding your invoice.   Our billing staff will not be able to assist you with questions regarding bills from these companies.  You will be contacted with the lab results as soon as they are available. The fastest way to get your results is to activate your My Chart account. Instructions are located on the last page of this paperwork. If you have not heard from [place] regarding the results in 2 weeks, please contact this office.

## 2017-11-26 ENCOUNTER — Encounter: Payer: Self-pay | Admitting: Family Medicine

## 2018-01-10 ENCOUNTER — Ambulatory Visit: Payer: BLUE CROSS/BLUE SHIELD | Admitting: Emergency Medicine

## 2018-01-10 ENCOUNTER — Encounter: Payer: Self-pay | Admitting: Emergency Medicine

## 2018-01-10 ENCOUNTER — Other Ambulatory Visit: Payer: Self-pay

## 2018-01-10 VITALS — BP 110/62 | HR 107 | Temp 99.4°F | Resp 16 | Ht 59.25 in | Wt 144.2 lb

## 2018-01-10 DIAGNOSIS — R05 Cough: Secondary | ICD-10-CM

## 2018-01-10 DIAGNOSIS — N399 Disorder of urinary system, unspecified: Secondary | ICD-10-CM

## 2018-01-10 DIAGNOSIS — R062 Wheezing: Secondary | ICD-10-CM

## 2018-01-10 DIAGNOSIS — R300 Dysuria: Secondary | ICD-10-CM

## 2018-01-10 DIAGNOSIS — J22 Unspecified acute lower respiratory infection: Secondary | ICD-10-CM | POA: Insufficient documentation

## 2018-01-10 DIAGNOSIS — N3000 Acute cystitis without hematuria: Secondary | ICD-10-CM | POA: Insufficient documentation

## 2018-01-10 DIAGNOSIS — R059 Cough, unspecified: Secondary | ICD-10-CM

## 2018-01-10 LAB — POCT URINALYSIS DIP (MANUAL ENTRY)
Bilirubin, UA: NEGATIVE
Glucose, UA: NEGATIVE mg/dL
Ketones, POC UA: NEGATIVE mg/dL
Nitrite, UA: NEGATIVE
Protein Ur, POC: NEGATIVE mg/dL
SPEC GRAV UA: 1.015 (ref 1.010–1.025)
UROBILINOGEN UA: 0.2 U/dL
pH, UA: 7 (ref 5.0–8.0)

## 2018-01-10 MED ORDER — PREDNISONE 20 MG PO TABS: 40.0000 mg | ORAL_TABLET | Freq: Every day | ORAL | 0 refills | Status: AC

## 2018-01-10 MED ORDER — AMOXICILLIN-POT CLAVULANATE 875-125 MG PO TABS: 1.0000 | ORAL_TABLET | Freq: Two times a day (BID) | ORAL | 0 refills | Status: AC

## 2018-01-10 NOTE — Progress Notes (Signed)
a 

## 2018-01-10 NOTE — Patient Instructions (Addendum)
If you have lab work done today you will be contacted with your lab results within the next 2 weeks.  If you have not heard from [place] then please contact us. The fastest way to get your results is to register for My Chart.   IF you received an x-ray today, you will receive an invoice from [facility]. Please contact [facility] at [phone] with questions or concerns regarding your invoice.   IF you received labwork today, you will receive an invoice from [facility]. Please contact [facility] at [phone] with questions or concerns regarding your invoice.   Our billing staff will not be able to assist you with questions regarding bills from these companies.  You will be contacted with the lab results as soon as they are available. The fastest way to get your results is to activate your My Chart account. Instructions are located on the last page of this paperwork. If you have not heard from [place] regarding the results in 2 weeks, please contact this office.     Urinary Tract Infection, Adult A urinary tract infection (UTI) is an infection of any part of the urinary tract. The urinary tract includes the:  Kidneys.  Ureters.  Bladder.  Urethra.  These organs make, store, and get rid of pee (urine) in the body. Follow these instructions at home:  Take over-the-counter and prescription medicines only as told by your doctor.  If you were prescribed an antibiotic medicine, take it as told by your doctor. Do not stop taking the antibiotic even if you start to feel better.  Avoid the following drinks: ? Alcohol. ? Caffeine. ? Tea. ? Carbonated drinks.  Drink enough fluid to keep your pee clear or pale yellow.  Keep all follow-up visits as told by your doctor. This is important.  Make sure to: ? Empty your bladder often and completely. Do not to hold pee for long periods of time. ? Empty your bladder before and after sex. ? Wipe from front to back after a  bowel movement if you are female. Use each tissue one time when you wipe. Contact a doctor if:  You have back pain.  You have a fever.  You feel sick to your stomach (nauseous).  You throw up (vomit).  Your symptoms do not get better after 3 days.  Your symptoms go away and then come back. Get help right away if:  You have very bad back pain.  You have very bad lower belly (abdominal) pain.  You are throwing up and cannot keep down any medicines or water. This information is not intended to replace advice given to you by your health care provider. Make sure you discuss any questions you have with your health care provider. Document Released: 07/21/2007 Document Revised: 07/10/2015 Document Reviewed: 12/23/2014 Elsevier Interactive Patient Education  2018 [employer].  Acute Bronchitis, Adult Acute bronchitis is when air tubes (bronchi) in the lungs suddenly get swollen. The condition can make it hard to breathe. It can also cause these symptoms:  A cough.  Coughing up clear, yellow, or green mucus.  Wheezing.  Chest congestion.  Shortness of breath.  A fever.  Body aches.  Chills.  A sore throat.  Follow these instructions at home: Medicines  Take over-the-counter and prescription medicines only as told by your doctor.  If you were prescribed an antibiotic medicine, take it as told by your doctor. Do not stop taking the antibiotic even if you start to feel better.  General instructions  Rest.  Drink enough fluids to keep your pee (urine) clear or pale yellow.  Avoid smoking and secondhand smoke. If you smoke and you need help quitting, ask your doctor. Quitting will help your lungs heal faster.  Use an inhaler, cool mist vaporizer, or humidifier as told by your doctor.  Keep all follow-up visits as told by your doctor. This is important. How is this prevented? To lower your risk of getting this condition again:  Wash your hands often with soap and  water. If you cannot use soap and water, use hand sanitizer.  Avoid contact with people who have cold symptoms.  Try not to touch your hands to your mouth, nose, or eyes.  Make sure to get the flu shot every year.  Contact a doctor if:  Your symptoms do not get better in 2 weeks. Get help right away if:  You cough up blood.  You have chest pain.  You have very bad shortness of breath.  You become dehydrated.  You faint (pass out) or keep feeling like you are going to pass out.  You keep throwing up (vomiting).  You have a very bad headache.  Your fever or chills gets worse. This information is not intended to replace advice given to you by your health care provider. Make sure you discuss any questions you have with your health care provider. Document Released: 07/21/2007 Document Revised: 09/10/2015 Document Reviewed: 07/23/2015 Elsevier Interactive Patient Education  [employer].
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
## 2018-01-11 LAB — URINE CULTURE

## 2018-01-13 ENCOUNTER — Encounter: Payer: Self-pay | Admitting: *Deleted

## 2018-03-21 IMAGING — MG 2D DIGITAL SCREENING BILATERAL MAMMOGRAM WITH CAD AND ADJUNCT TO
9 of 13 series · 9 of 29 positions shown · non-contrast
Comparison: Previous exam(s).

CLINICAL DATA: Screening.

EXAM:
2D DIGITAL SCREENING BILATERAL MAMMOGRAM WITH CAD AND ADJUNCT TOMO

[L CC (1 of 2)]
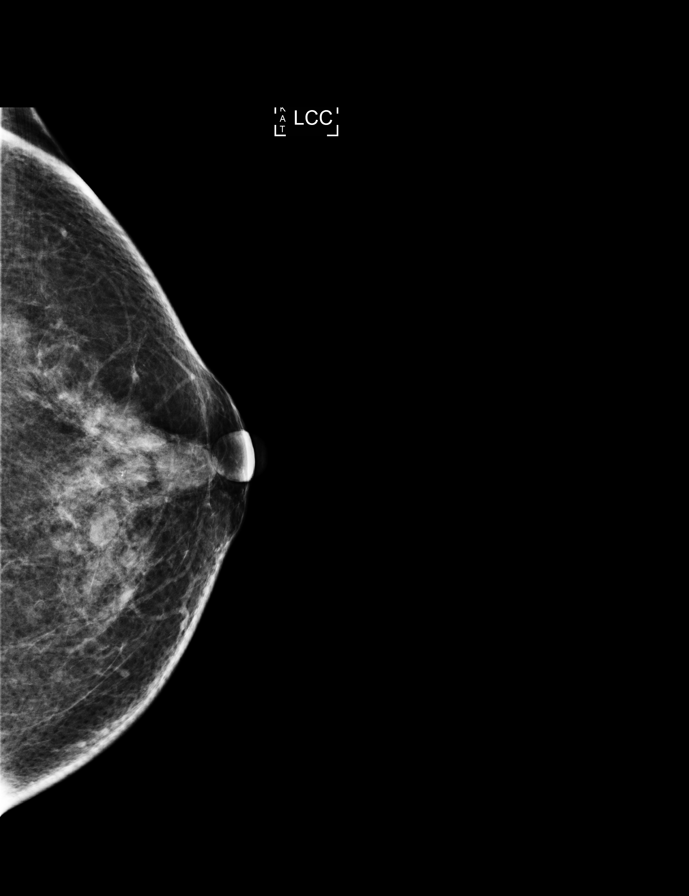

[L MLO]
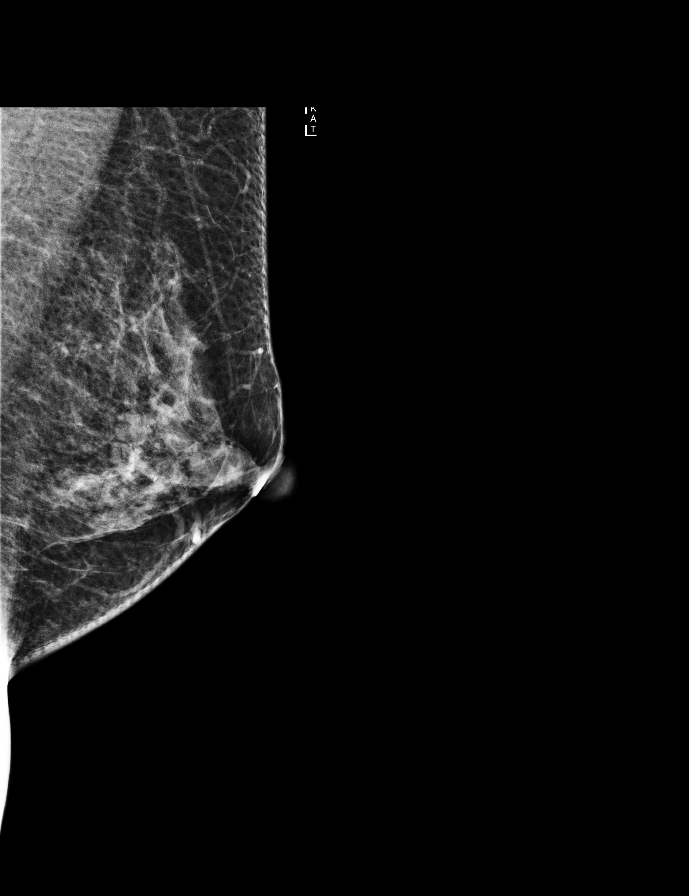

[L MLO synth-2D]
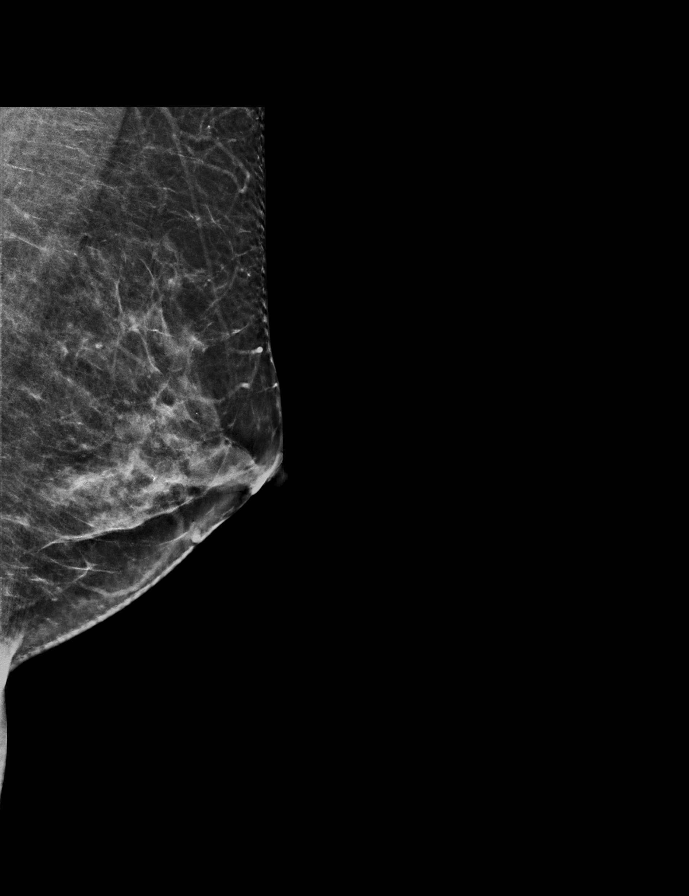

[R CC]
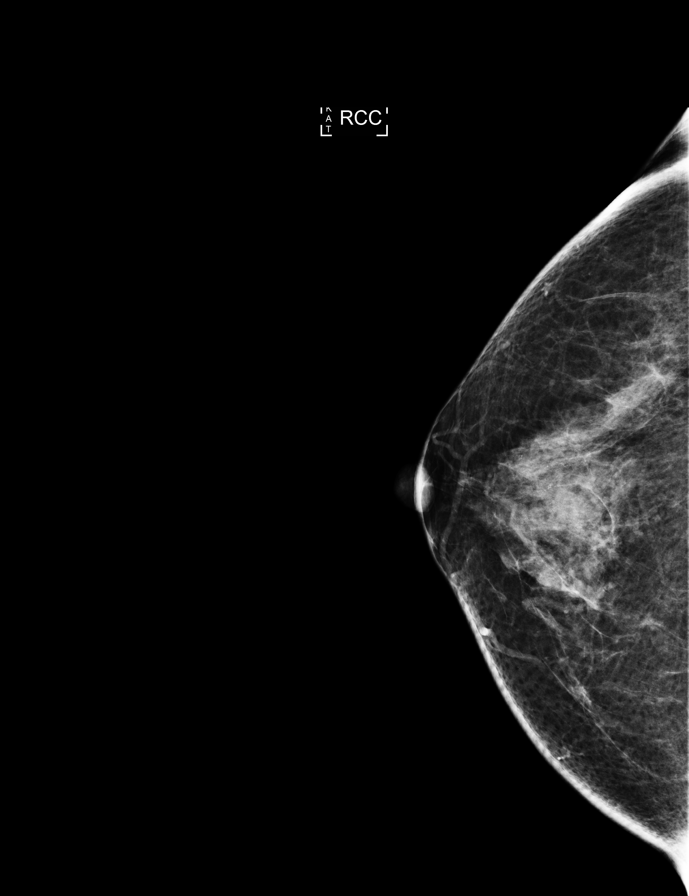

[L CC (2 of 2)]
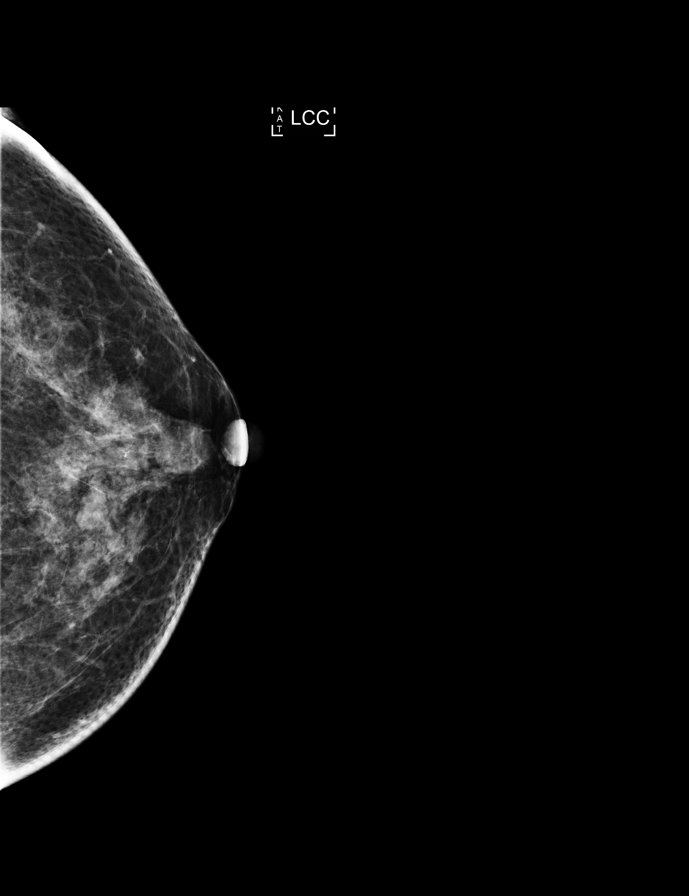

[L CC synth-2D]
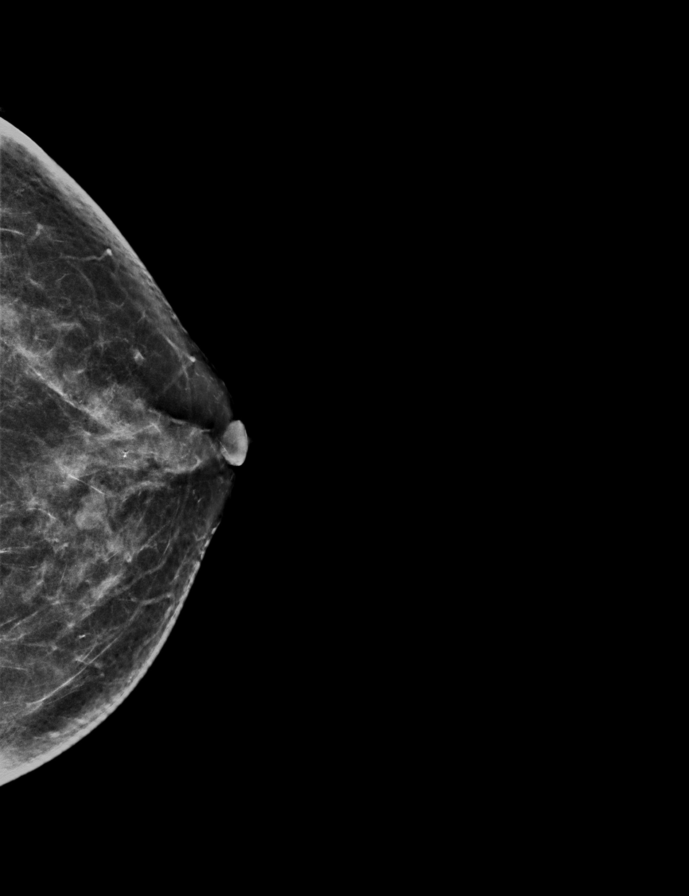

[R MLO synth-2D]
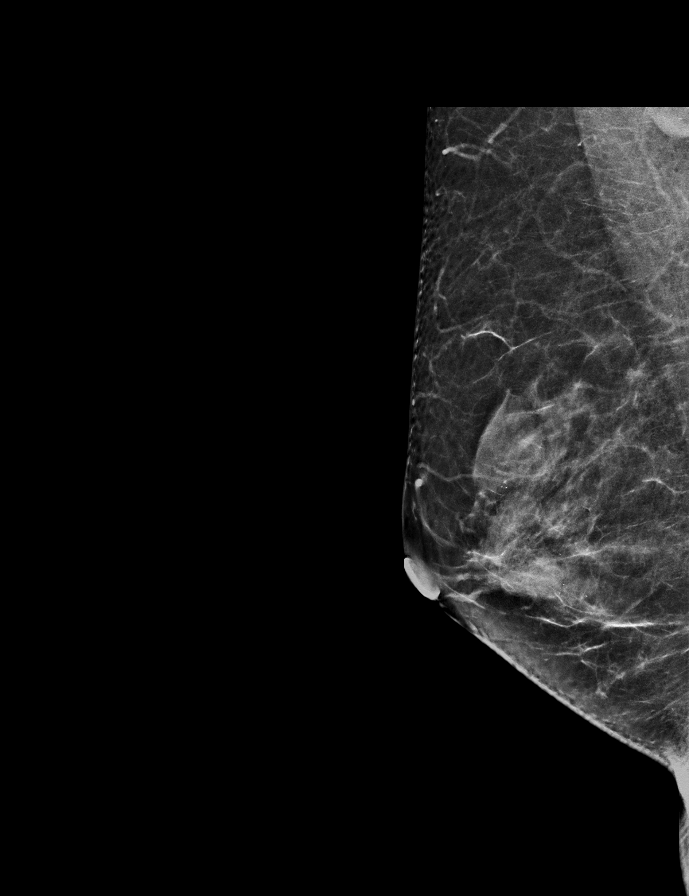

[R CC synth-2D]
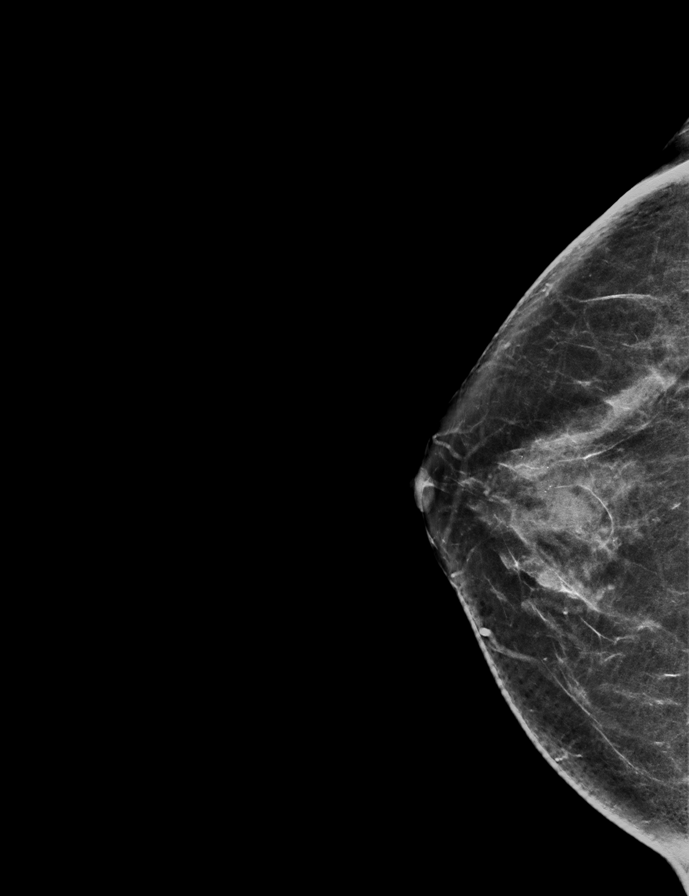

[R MLO]
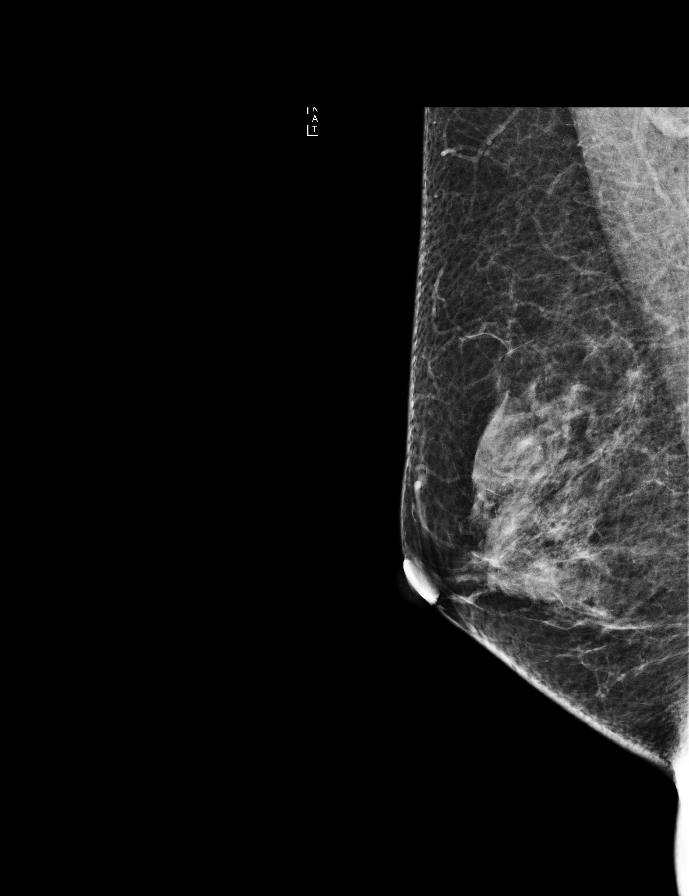

[9 of 29 positions shown; findings below may reference images not displayed]

ACR Breast Density Category c: The breast tissue is heterogeneously
dense, which may obscure small masses.
FINDINGS: There are no findings suspicious for malignancy. Images were
processed with CAD.
IMPRESSION: No mammographic evidence of malignancy. A result letter of this
screening mammogram will be mailed directly to the patient.

RECOMMENDATION:
Screening mammogram in one year. (Code:[id number])

BI-RADS CATEGORY  1: Negative.

## 2018-11-08 ENCOUNTER — Encounter: Payer: Self-pay | Admitting: Gynecology

## 2019-03-26 ENCOUNTER — Ambulatory Visit (INDEPENDENT_AMBULATORY_CARE_PROVIDER_SITE_OTHER): Payer: 59 | Admitting: Emergency Medicine

## 2019-03-26 ENCOUNTER — Encounter: Payer: Self-pay | Admitting: Emergency Medicine

## 2019-03-26 ENCOUNTER — Other Ambulatory Visit: Payer: Self-pay

## 2019-03-26 VITALS — BP 108/73 | HR 83 | Temp 98.2°F | Resp 16 | Ht 60.0 in | Wt 153.0 lb

## 2019-03-26 DIAGNOSIS — N951 Menopausal and female climacteric states: Secondary | ICD-10-CM

## 2019-03-26 DIAGNOSIS — Z1329 Encounter for screening for other suspected endocrine disorder: Secondary | ICD-10-CM

## 2019-03-26 DIAGNOSIS — Z131 Encounter for screening for diabetes mellitus: Secondary | ICD-10-CM

## 2019-03-26 DIAGNOSIS — Z13228 Encounter for screening for other metabolic disorders: Secondary | ICD-10-CM

## 2019-03-26 DIAGNOSIS — Z130 Encounter for screening for diseases of the blood and blood-forming organs and certain disorders involving the immune mechanism: Secondary | ICD-10-CM

## 2019-03-26 DIAGNOSIS — Z0000 Encounter for general adult medical examination without abnormal findings: Secondary | ICD-10-CM

## 2019-03-26 DIAGNOSIS — Z0001 Encounter for general adult medical examination with abnormal findings: Secondary | ICD-10-CM | POA: Diagnosis not present

## 2019-03-26 DIAGNOSIS — Z13220 Encounter for screening for lipoid disorders: Secondary | ICD-10-CM

## 2019-03-26 DIAGNOSIS — Z1211 Encounter for screening for malignant neoplasm of colon: Secondary | ICD-10-CM

## 2019-03-26 DIAGNOSIS — R350 Frequency of micturition: Secondary | ICD-10-CM

## 2019-03-26 DIAGNOSIS — Z114 Encounter for screening for human immunodeficiency virus [HIV]: Secondary | ICD-10-CM

## 2019-03-26 DIAGNOSIS — E559 Vitamin D deficiency, unspecified: Secondary | ICD-10-CM

## 2019-03-26 DIAGNOSIS — Z1321 Encounter for screening for nutritional disorder: Secondary | ICD-10-CM

## 2019-03-26 LAB — POCT URINALYSIS DIP (MANUAL ENTRY)
Bilirubin, UA: NEGATIVE
Glucose, UA: NEGATIVE mg/dL
Ketones, POC UA: NEGATIVE mg/dL
Nitrite, UA: NEGATIVE
Protein Ur, POC: NEGATIVE mg/dL
Spec Grav, UA: 1.015 (ref 1.010–1.025)
Urobilinogen, UA: 0.2 E.U./dL
pH, UA: 5.5 (ref 5.0–8.0)

## 2019-03-26 LAB — POC MICROSCOPIC URINALYSIS (UMFC): Mucus: ABSENT

## 2019-03-26 NOTE — Addendum Note (Signed)
Addended by: Alfredia Ferguson A on: 03/26/2019 02:19 PM   Modules accepted: Orders

## 2019-03-26 NOTE — Patient Instructions (Addendum)
Health Maintenance, Female Adopting a healthy lifestyle and getting preventive care are important in promoting health and wellness. Ask your health care provider about:  The right schedule for you to have regular tests and exams.  Things you can do on your own to prevent diseases and keep yourself healthy. What should I know about diet, weight, and exercise? Eat a healthy diet   Eat a diet that includes plenty of vegetables, fruits, low-fat dairy products, and lean protein.  Do not eat a lot of foods that are high in solid fats, added sugars, or sodium. Maintain a healthy weight Body mass index (BMI) is used to identify weight problems. It estimates body fat based on height and weight. Your health care provider can help determine your BMI and help you achieve or maintain a healthy weight. Get regular exercise Get regular exercise. This is one of the most important things you can do for your health. Most adults should:  Exercise for at least 150 minutes each week. The exercise should increase your heart rate and make you sweat (moderate-intensity exercise).  Do strengthening exercises at least twice a week. This is in addition to the moderate-intensity exercise.  Spend less time sitting. Even light physical activity can be beneficial. Watch cholesterol and blood lipids Have your blood tested for lipids and cholesterol at 52 years of age, then have this test every 5 years. Have your cholesterol levels checked more often if:  Your lipid or cholesterol levels are high.  You are older than 52 years of age.  You are at high risk for heart disease. What should I know about cancer screening? Depending on your health history and family history, you may need to have cancer screening at various ages. This may include screening for:  Breast cancer.  Cervical cancer.  Colorectal cancer.  Skin cancer.  Lung cancer. What should I know about heart disease, diabetes, and high blood  pressure? Blood pressure and heart disease  High blood pressure causes heart disease and increases the risk of stroke. This is more likely to develop in people who have high blood pressure readings, are of African descent, or are overweight.  Have your blood pressure checked: ? Every 3-5 years if you are 54-9 years of age. ? Every year if you are 69 years old or older. Diabetes Have regular diabetes screenings. This checks your fasting blood sugar level. Have the screening done:  Once every three years after age 36 if you are at a normal weight and have a low risk for diabetes.  More often and at a younger age if you are overweight or have a high risk for diabetes. What should I know about preventing infection? Hepatitis B If you have a higher risk for hepatitis B, you should be screened for this virus. Talk with your health care provider to find out if you are at risk for hepatitis B infection. Hepatitis C Testing is recommended for:  Everyone born from [birth year] through 1963.  Anyone with known risk factors for hepatitis C. Sexually transmitted infections (STIs)  Get screened for STIs, including gonorrhea and chlamydia, if: ? You are sexually active and are younger than 52 years of age. ? You are older than 52 years of age and your health care provider tells you that you are at risk for this type of infection. ? Your sexual activity has changed since you were last screened, and you are at increased risk for chlamydia or gonorrhea. Ask your health care provider  provider if you are at risk.  Ask your health care provider about whether you are at high risk for HIV. Your health care provider may recommend a prescription medicine to help prevent HIV infection. If you choose to take medicine to prevent HIV, you should first get tested for HIV. You should then be tested every 3 months for as long as you are taking the medicine. Pregnancy  If you are about to stop having your period (premenopausal) and  you may become pregnant, seek counseling before you get pregnant.  Take 400 to 800 micrograms (mcg) of folic acid every day if you become pregnant.  Ask for birth control (contraception) if you want to prevent pregnancy. Osteoporosis and menopause Osteoporosis is a disease in which the bones lose minerals and strength with aging. This can result in bone fractures. If you are 65 years old or older, or if you are at risk for osteoporosis and fractures, ask your health care provider if you should:  Be screened for bone loss.  Take a calcium or vitamin D supplement to lower your risk of fractures.  Be given hormone replacement therapy (HRT) to treat symptoms of menopause. Follow these instructions at home: Lifestyle  Do not use any products that contain nicotine or tobacco, such as cigarettes, e-cigarettes, and chewing tobacco. If you need help quitting, ask your health care provider.  Do not use street drugs.  Do not share needles.  Ask your health care provider for help if you need support or information about quitting drugs. Alcohol use  Do not drink alcohol if: ? Your health care provider tells you not to drink. ? You are pregnant, may be pregnant, or are planning to become pregnant.  If you drink alcohol: ? Limit how much you use to 0-1 drink a day. ? Limit intake if you are breastfeeding.  Be aware of how much alcohol is in your drink. In the [place] one drink equals one 12 oz bottle of beer (355 mL), one 5 oz glass of wine (148 mL), or one 1 oz glass of hard liquor (44 mL). General instructions  Schedule regular health, dental, and eye exams.  Stay current with your vaccines.  Tell your health care provider if: ? You often feel depressed. ? You have ever been abused or do not feel safe at home. Summary  Adopting a healthy lifestyle and getting preventive care are important in promoting health and wellness.  Follow your health care provider's instructions about healthy  diet, exercising, and getting tested or screened for diseases.  Follow your health care provider's instructions on monitoring your cholesterol and blood pressure. This information is not intended to replace advice given to you by your health care provider. Make sure you discuss any questions you have with your health care provider. Document Revised: 01/25/2018 Document Reviewed: 01/25/2018 Elsevier Patient Education  2020 Elsevier Inc.   If you have lab work done today you will be contacted with your lab results within the next 2 weeks.  If you have not heard from us then please contact us. The fastest way to get your results is to register for My Chart.   IF you received an x-ray today, you will receive an invoice from [facility]. Please contact [facility] at [phone] with questions or concerns regarding your invoice.   IF you received labwork today, you will receive an invoice from [facility]. Please contact [facility] at [phone] with questions or concerns regarding your invoice.   Our billing staff   staff will not be able to assist you with questions regarding bills from these companies.  You will be contacted with the lab results as soon as they are available. The fastest way to get your results is to activate your My Chart account. Instructions are located on the last page of this paperwork. If you have not heard from [place] regarding the results in 2 weeks, please contact this office.

## 2019-03-26 NOTE — Progress Notes (Signed)
Emily Lara 55 y.o.   Chief Complaint  Patient presents with  . Annual Exam    HISTORY OF PRESENT ILLNESS: This is a 54 y.o. female here for annual exam. No significant past medical history. History of vitamin D deficiency. Non-smoker no EtOH user. Works regular hours 5 days a week. Eats well and sleeps well. Complaining of generalized achiness for the past 2 to 3 months. Perimenopausal. Health maintenance items discussed with patient. Will refer for colonoscopy. Mammogram scheduled for March 2021. Schedule to see gynecologist when she will get Pap smear done. No other complaints or medical concerns today.  HPI   Prior to Admission medications   Medication Sig Start Date End Date Taking? Authorizing Provider  Multiple Vitamin (MULTIVITAMIN) tablet Take 1 tablet by mouth daily.   Yes [provider]  Vitamin D, Ergocalciferol, 2000 units CAPS Take by mouth.   Yes [provider]  albuterol (PROVENTIL HFA;VENTOLIN HFA) 108 (90 Base) MCG/ACT inhaler Inhale 1-2 puffs into the lungs every 4 (four) hours as needed for wheezing or shortness of breath. Patient not taking: Reported on 03/26/2019 11/22/17   Wendie Agreste, MD  Dextromethorphan HBr (VICKS DAYQUIL COUGH PO) Take by mouth.    [provider]  guaiFENesin-dextromethorphan (ROBITUSSIN DM) 100-10 MG/5ML syrup Take 5 mLs by mouth every 4 (four) hours as needed for cough.    [provider]  HYDROcodone-homatropine (HYCODAN) 5-1.5 MG/5ML syrup 6m by mouth a bedtime as needed for cough. Patient not taking: Reported on 03/26/2019 11/22/17   Wendie Agreste, MD    No Known Allergies  Patient Active Problem List   Diagnosis Date Noted  . Vitamin D insufficiency 07/26/2015    Past Medical History:  Diagnosis Date  . Allergy     Past Surgical History:  Procedure Laterality Date  . CESAREAN SECTION  2007    Social History   Socioeconomic History  . Marital status: Married   Spouse name: Not on file  . Number of children: Not on file  . Years of education: Not on file  . Highest education level: Not on file  Occupational History  . Not on file  Tobacco Use  . Smoking status: Never Smoker  . Smokeless tobacco: Never Used  Substance and Sexual Activity  . Alcohol use: No    Alcohol/week: 0.0 standard drinks  . Drug use: No  . Sexual activity: Not Currently    Partners: Male    Comment: 1st intercourse 53 yo-1 partner  Other Topics Concern  . Not on file  Social History Narrative  . Not on file   Social Determinants of Health   Financial Resource Strain:   . Difficulty of Paying Living Expenses: Not on file  Food Insecurity:   . Worried About Charity fundraiser in the Last Year: Not on file  . Ran Out of Food in the Last Year: Not on file  Transportation Needs:   . Lack of Transportation (Medical): Not on file  . Lack of Transportation (Non-Medical): Not on file  Physical Activity:   . Days of Exercise per Week: Not on file  . Minutes of Exercise per Session: Not on file  Stress:   . Feeling of Stress : Not on file  Social Connections:   . Frequency of Communication with Friends and Family: Not on file  . Frequency of Social Gatherings with Friends and Family: Not on file  . Attends Religious Services: Not on file  . Active Member  of Clubs or Organizations: Not on file  . Attends Archivist Meetings: Not on file  . Marital Status: Not on file  Intimate Partner Violence:   . Fear of Current or Ex-Partner: Not on file  . Emotionally Abused: Not on file  . Physically Abused: Not on file  . Sexually Abused: Not on file    Family History  Problem Relation Age of Onset  . Hyperlipidemia Father   . Thyroid disease Father   . Alcohol abuse Neg Hx   . Cancer Neg Hx   . COPD Neg Hx   . Depression Neg Hx   . Diabetes Neg Hx   . Drug abuse Neg Hx   . Early death Neg Hx   . Hearing loss Neg Hx   . Heart disease Neg Hx   .  Hypertension Neg Hx   . Kidney disease Neg Hx   . Stroke Neg Hx      Review of Systems  Constitutional: Positive for malaise/fatigue. Negative for chills, fever and weight loss.  HENT: Negative.  Negative for congestion and sore throat.   Eyes: Negative.   Respiratory: Negative.  Negative for cough and shortness of breath.   Cardiovascular: Negative.  Negative for chest pain and palpitations.  Gastrointestinal: Negative.  Negative for abdominal pain, blood in stool, constipation, diarrhea, melena, nausea and vomiting.  Genitourinary: Negative.  Negative for dysuria and hematuria.  Musculoskeletal: Positive for joint pain (Diffuse). Negative for back pain, myalgias and neck pain.  Skin: Negative.  Negative for rash.  Neurological: Negative.  Negative for dizziness and headaches.  Endo/Heme/Allergies: Negative.   All other systems reviewed and are negative.  Today's Vitals   03/26/19 1336  BP: 108/73  Pulse: 83  Resp: 16  Temp: 98.2 F (36.8 C)  TempSrc: Temporal  SpO2: 96%  Weight: 153 lb (69.4 kg)  Height: 5' (1.524 m)   Body mass index is 29.88 kg/m.   Physical Exam Vitals reviewed.  Constitutional:      Appearance: Normal appearance.  HENT:     Head: Normocephalic.     Mouth/Throat:     Mouth: Mucous membranes are moist.     Pharynx: Oropharynx is clear.  Eyes:     Extraocular Movements: Extraocular movements intact.     Conjunctiva/sclera: Conjunctivae normal.     Pupils: Pupils are equal, round, and reactive to light.  Neck:     Vascular: No carotid bruit.  Cardiovascular:     Rate and Rhythm: Normal rate and regular rhythm.     Pulses: Normal pulses.     Heart sounds: Normal heart sounds.  Pulmonary:     Effort: Pulmonary effort is normal.     Breath sounds: Normal breath sounds.  Abdominal:     General: Bowel sounds are normal. There is no distension.     Palpations: Abdomen is soft. There is no mass.     Tenderness: There is no abdominal tenderness.  There is no guarding.  Musculoskeletal:        General: No tenderness. Normal range of motion.     Cervical back: Normal range of motion and neck supple. No tenderness.     Right lower leg: No edema.     Left lower leg: No edema.  Lymphadenopathy:     Cervical: No cervical adenopathy.  Skin:    General: Skin is warm and dry.     Capillary Refill: Capillary refill takes less than 2 seconds.  Neurological:  General: No focal deficit present.     Mental Status: She is alert and oriented to person, place, and time.  Psychiatric:        Mood and Affect: Mood normal.        Behavior: Behavior normal.      ASSESSMENT & PLAN:  Betzy was seen today for annual exam.  Diagnoses and all orders for this visit:  Routine general medical examination at a health care facility  Screening for deficiency anemia -     CBC with Differential/Platelet  Screening for endocrine, nutritional, metabolic and immunity disorder -     Comprehensive metabolic panel -     Hemoglobin A1c  Screening for diabetes mellitus -     Hemoglobin A1c  Screening for lipoid disorders -     Lipid panel  Vitamin D insufficiency -     VITAMIN D 25 Hydroxy (Vit-D Deficiency, Fractures)  Colon cancer screening -     Ambulatory referral to Gastroenterology  Perimenopause  Screening for HIV (human immunodeficiency virus) -     HIV Antibody (routine testing w rflx)    Patient Instructions     Health Maintenance, Female Adopting a healthy lifestyle and getting preventive care are important in promoting health and wellness. Ask your health care provider about:  The right schedule for you to have regular tests and exams.  Things you can do on your own to prevent diseases and keep yourself healthy. What should I know about diet, weight, and exercise? Eat a healthy diet   Eat a diet that includes plenty of vegetables, fruits, low-fat dairy products, and lean protein.  Do not eat a lot of foods that  are high in solid fats, added sugars, or sodium. Maintain a healthy weight Body mass index (BMI) is used to identify weight problems. It estimates body fat based on height and weight. Your health care provider can help determine your BMI and help you achieve or maintain a healthy weight. Get regular exercise Get regular exercise. This is one of the most important things you can do for your health. Most adults should:  Exercise for at least 150 minutes each week. The exercise should increase your heart rate and make you sweat (moderate-intensity exercise).  Do strengthening exercises at least twice a week. This is in addition to the moderate-intensity exercise.  Spend less time sitting. Even light physical activity can be beneficial. Watch cholesterol and blood lipids Have your blood tested for lipids and cholesterol at 52 years of age, then have this test every 5 years. Have your cholesterol levels checked more often if:  Your lipid or cholesterol levels are high.  You are older than 52 years of age.  You are at high risk for heart disease. What should I know about cancer screening? Depending on your health history and family history, you may need to have cancer screening at various ages. This may include screening for:  Breast cancer.  Cervical cancer.  Colorectal cancer.  Skin cancer.  Lung cancer. What should I know about heart disease, diabetes, and high blood pressure? Blood pressure and heart disease  High blood pressure causes heart disease and increases the risk of stroke. This is more likely to develop in people who have high blood pressure readings, are of African descent, or are overweight.  Have your blood pressure checked: ? Every 3-5 years if you are 77-71 years of age. ? Every year if you are 37 years old or older. Diabetes Have regular diabetes  screenings. This checks your fasting blood sugar level. Have the screening done:  Once every three years after age  16 if you are at a normal weight and have a low risk for diabetes.  More often and at a younger age if you are overweight or have a high risk for diabetes. What should I know about preventing infection? Hepatitis B If you have a higher risk for hepatitis B, you should be screened for this virus. Talk with your health care provider to find out if you are at risk for hepatitis B infection. Hepatitis C Testing is recommended for:  Everyone born from [birth year] through 1963.  Anyone with known risk factors for hepatitis C. Sexually transmitted infections (STIs)  Get screened for STIs, including gonorrhea and chlamydia, if: ? You are sexually active and are younger than 52 years of age. ? You are older than 52 years of age and your health care provider tells you that you are at risk for this type of infection. ? Your sexual activity has changed since you were last screened, and you are at increased risk for chlamydia or gonorrhea. Ask your health care provider if you are at risk.  Ask your health care provider about whether you are at high risk for HIV. Your health care provider may recommend a prescription medicine to help prevent HIV infection. If you choose to take medicine to prevent HIV, you should first get tested for HIV. You should then be tested every 3 months for as long as you are taking the medicine. Pregnancy  If you are about to stop having your period (premenopausal) and you may become pregnant, seek counseling before you get pregnant.  Take 400 to 800 micrograms (mcg) of folic acid every day if you become pregnant.  Ask for birth control (contraception) if you want to prevent pregnancy. Osteoporosis and menopause Osteoporosis is a disease in which the bones lose minerals and strength with aging. This can result in bone fractures. If you are 48 years old or older, or if you are at risk for osteoporosis and fractures, ask your health care provider if you should:  Be screened for bone  loss.  Take a calcium or vitamin D supplement to lower your risk of fractures.  Be given hormone replacement therapy (HRT) to treat symptoms of menopause. Follow these instructions at home: Lifestyle  Do not use any products that contain nicotine or tobacco, such as cigarettes, e-cigarettes, and chewing tobacco. If you need help quitting, ask your health care provider.  Do not use street drugs.  Do not share needles.  Ask your health care provider for help if you need support or information about quitting drugs. Alcohol use  Do not drink alcohol if: ? Your health care provider tells you not to drink. ? You are pregnant, may be pregnant, or are planning to become pregnant.  If you drink alcohol: ? Limit how much you use to 0-1 drink a day. ? Limit intake if you are breastfeeding.  Be aware of how much alcohol is in your drink. In the [place] one drink equals one 12 oz bottle of beer (355 mL), one 5 oz glass of wine (148 mL), or one 1 oz glass of hard liquor (44 mL). General instructions  Schedule regular health, dental, and eye exams.  Stay current with your vaccines.  Tell your health care provider if: ? You often feel depressed. ? You have ever been abused or do not feel safe at home. Summary  Adopting a healthy lifestyle and getting preventive care are important in promoting health and wellness.  Follow your health care provider's instructions about healthy diet, exercising, and getting tested or screened for diseases.  Follow your health care provider's instructions on monitoring your cholesterol and blood pressure. This information is not intended to replace advice given to you by your health care provider. Make sure you discuss any questions you have with your health care provider. Document Revised: 01/25/2018 Document Reviewed: 01/25/2018 Elsevier Patient Education  [employer].     If you have lab work done today you will be contacted with your lab  results within the next 2 weeks.  If you have not heard from [place] then please contact us. The fastest way to get your results is to register for My Chart.   IF you received an x-ray today, you will receive an invoice from [facility]. Please contact [facility] at [phone] with questions or concerns regarding your invoice.   IF you received labwork today, you will receive an invoice from [facility]. Please contact [facility] at [phone] with questions or concerns regarding your invoice.   Our billing staff will not be able to assist you with questions regarding bills from these companies.  You will be contacted with the lab results as soon as they are available. The fastest way to get your results is to activate your My Chart account. Instructions are located on the last page of this paperwork. If you have not heard from [place] regarding the results in 2 weeks, please contact this office.        Agustina Caroli, MD [facility]

## 2019-03-27 ENCOUNTER — Other Ambulatory Visit: Payer: Self-pay | Admitting: Emergency Medicine

## 2019-03-27 ENCOUNTER — Encounter: Payer: Self-pay | Admitting: Emergency Medicine

## 2019-03-27 DIAGNOSIS — E559 Vitamin D deficiency, unspecified: Secondary | ICD-10-CM

## 2019-03-27 LAB — CBC WITH DIFFERENTIAL/PLATELET
Basophils Absolute: 0 10*3/uL (ref 0.0–0.2)
Basos: 1 %
EOS (ABSOLUTE): 0.2 10*3/uL (ref 0.0–0.4)
Eos: 2 %
Hematocrit: 42 % (ref 34.0–46.6)
Hemoglobin: 13.7 g/dL (ref 11.1–15.9)
Immature Grans (Abs): 0 10*3/uL (ref 0.0–0.1)
Immature Granulocytes: 0 %
Lymphocytes Absolute: 2.3 10*3/uL (ref 0.7–3.1)
Lymphs: 36 %
MCH: 31 pg (ref 26.6–33.0)
MCHC: 32.6 g/dL (ref 31.5–35.7)
MCV: 95 fL (ref 79–97)
Monocytes Absolute: 0.3 10*3/uL (ref 0.1–0.9)
Monocytes: 5 %
Neutrophils Absolute: 3.6 10*3/uL (ref 1.4–7.0)
Neutrophils: 56 %
Platelets: 247 10*3/uL (ref 150–450)
RBC: 4.42 x10E6/uL (ref 3.77–5.28)
RDW: 13 % (ref 11.7–15.4)
WBC: 6.4 10*3/uL (ref 3.4–10.8)

## 2019-03-27 LAB — COMPREHENSIVE METABOLIC PANEL
ALT: 71 IU/L — ABNORMAL HIGH (ref 0–32)
AST: 45 IU/L — ABNORMAL HIGH (ref 0–40)
Albumin/Globulin Ratio: 1.6 (ref 1.2–2.2)
Albumin: 4.6 g/dL (ref 3.8–4.9)
Alkaline Phosphatase: 73 IU/L (ref 39–117)
BUN/Creatinine Ratio: 17 (ref 9–23)
BUN: 9 mg/dL (ref 6–24)
Bilirubin Total: 0.7 mg/dL (ref 0.0–1.2)
CO2: 20 mmol/L (ref 20–29)
Calcium: 9.5 mg/dL (ref 8.7–10.2)
Chloride: 103 mmol/L (ref 96–106)
Creatinine, Ser: 0.54 mg/dL — ABNORMAL LOW (ref 0.57–1.00)
GFR calc Af Amer: 126 mL/min/{1.73_m2} (ref >59)
GFR calc non Af Amer: 110 mL/min/{1.73_m2} (ref >59)
Globulin, Total: 2.9 g/dL (ref 1.5–4.5)
Glucose: 101 mg/dL — ABNORMAL HIGH (ref 65–99)
Potassium: 3.9 mmol/L (ref 3.5–5.2)
Sodium: 143 mmol/L (ref 134–144)
Total Protein: 7.5 g/dL (ref 6.0–8.5)

## 2019-03-27 LAB — LIPID PANEL
Chol/HDL Ratio: 3.5 ratio (ref 0.0–4.4)
Cholesterol, Total: 167 mg/dL (ref 100–199)
HDL: 48 mg/dL (ref >39)
LDL Chol Calc (NIH): 92 mg/dL (ref 0–99)
Triglycerides: 155 mg/dL — ABNORMAL HIGH (ref 0–149)
VLDL Cholesterol Cal: 27 mg/dL (ref 5–40)

## 2019-03-27 LAB — HEMOGLOBIN A1C
Est. average glucose Bld gHb Est-mCnc: 123 mg/dL
Hgb A1c MFr Bld: 5.9 % — ABNORMAL HIGH (ref 4.8–5.6)

## 2019-03-27 LAB — HIV ANTIBODY (ROUTINE TESTING W REFLEX): HIV Screen 4th Generation wRfx: NONREACTIVE

## 2019-03-27 LAB — VITAMIN D 25 HYDROXY (VIT D DEFICIENCY, FRACTURES): Vit D, 25-Hydroxy: 20.8 ng/mL — ABNORMAL LOW (ref 30.0–100.0)

## 2019-03-27 MED ORDER — VITAMIN D (ERGOCALCIFEROL) 1.25 MG (50000 UNIT) PO CAPS: 50000.0000 [IU] | ORAL_CAPSULE | ORAL | 1 refills | Status: DC

## 2019-03-28 ENCOUNTER — Encounter: Payer: Self-pay | Admitting: Emergency Medicine

## 2019-04-04 ENCOUNTER — Encounter: Payer: Self-pay | Admitting: Emergency Medicine

## 2019-04-17 ENCOUNTER — Other Ambulatory Visit: Payer: Self-pay | Admitting: Obstetrics and Gynecology

## 2019-04-17 DIAGNOSIS — Z1231 Encounter for screening mammogram for malignant neoplasm of breast: Secondary | ICD-10-CM

## 2019-04-18 ENCOUNTER — Ambulatory Visit
Admission: RE | Admit: 2019-04-18 | Discharge: 2019-04-18 | Disposition: A | Discharge status: Home / Self Care | Payer: 59 | Source: Ambulatory Visit

## 2019-04-18 ENCOUNTER — Other Ambulatory Visit: Payer: Self-pay

## 2019-04-18 DIAGNOSIS — Z1231 Encounter for screening mammogram for malignant neoplasm of breast: Secondary | ICD-10-CM

## 2019-04-24 ENCOUNTER — Encounter: Payer: Self-pay | Admitting: Emergency Medicine

## 2019-04-25 ENCOUNTER — Encounter: Payer: Self-pay | Admitting: Gastroenterology

## 2019-04-25 ENCOUNTER — Telehealth: Payer: Self-pay

## 2019-04-25 ENCOUNTER — Telehealth: Payer: Self-pay | Admitting: Emergency Medicine

## 2019-04-25 DIAGNOSIS — Z1211 Encounter for screening for malignant neoplasm of colon: Secondary | ICD-10-CM

## 2019-04-25 NOTE — Telephone Encounter (Signed)
REFERRAL REQUEST Telephone Note  What type of referral do you need? Screening for colon cancer  Have you been seen at our office for this problem? Yes , She had a referral that was canceled due to her mailbox being full, she wants to know if it can be reordered (Advise that they will likely need an appointment with their PCP before a referral can be done)  Is there a particular doctor or location that you prefer? No   Patient notified that referrals can take up to a week or longer to process. If they haven't heard anything within a week they should call back and speak with the referral department.

## 2019-04-25 NOTE — Telephone Encounter (Signed)
New referral to gastroenterology has been placed in the system , patient has been informed.

## 2019-05-03 ENCOUNTER — Ambulatory Visit: Payer: 59

## 2019-05-04 ENCOUNTER — Ambulatory Visit: Payer: 59 | Attending: Internal Medicine

## 2019-05-04 DIAGNOSIS — Z23 Encounter for immunization: Secondary | ICD-10-CM

## 2019-05-04 NOTE — Progress Notes (Signed)
   Covid-19 Vaccination Clinic  Name:  JENOVA THUMAN    MRN: [id number] DOB: Dec 11, 1965  05/04/2019  Ms. Sedgwick was observed post Covid-19 immunization for 15 minutes without incident. She was provided with Vaccine Information Sheet and instruction to access the V-Safe system.   Ms. Ownbey was instructed to call 911 with any severe reactions post vaccine: Marland Kitchen Difficulty breathing  . Swelling of face and throat  . A fast heartbeat  . A bad rash all over body  . Dizziness and weakness   Immunizations Administered    Name Date Dose VIS Date Route   Pfizer COVID-19 Vaccine 05/04/2019  4:17 PM 0.3 mL 01/26/2019 Intramuscular   Manufacturer: [place]   Lot: [id number]   [place]: [id number]

## 2019-05-10 ENCOUNTER — Ambulatory Visit (INDEPENDENT_AMBULATORY_CARE_PROVIDER_SITE_OTHER): Payer: 59 | Admitting: Obstetrics and Gynecology

## 2019-05-10 ENCOUNTER — Encounter: Payer: Self-pay | Admitting: Obstetrics and Gynecology

## 2019-05-10 ENCOUNTER — Other Ambulatory Visit: Payer: Self-pay

## 2019-05-10 VITALS — BP 110/78 | Ht 61.0 in | Wt 150.0 lb

## 2019-05-10 DIAGNOSIS — Z124 Encounter for screening for malignant neoplasm of cervix: Secondary | ICD-10-CM | POA: Diagnosis not present

## 2019-05-10 DIAGNOSIS — R6882 Decreased libido: Secondary | ICD-10-CM

## 2019-05-10 DIAGNOSIS — N952 Postmenopausal atrophic vaginitis: Secondary | ICD-10-CM | POA: Diagnosis not present

## 2019-05-10 DIAGNOSIS — Z01419 Encounter for gynecological examination (general) (routine) without abnormal findings: Secondary | ICD-10-CM

## 2019-05-10 DIAGNOSIS — Z13820 Encounter for screening for osteoporosis: Secondary | ICD-10-CM

## 2019-05-10 NOTE — Progress Notes (Signed)
Emily Lara 1966/01/29 [id number]  SUBJECTIVE:  54 y.o. [id number] female for annual routine gynecologic exam and Pap smear. She has no gynecologic concerns.  She does note decreased libido and says this is putting a strain on relationship with her husband.  Otherwise relationship is okay, she feels that is really due to her decreased sex drive.  She also does note some vaginal discomfort with intercourse and has been seen lubricant not a lot of effect.  Current Outpatient Medications  Medication Sig Dispense Refill  . Multiple Vitamin (MULTIVITAMIN) tablet Take 1 tablet by mouth daily.    . Vitamin D, Ergocalciferol, (DRISDOL) 1.25 MG (50000 UNIT) CAPS capsule Take 1 capsule (50,000 Units total) by mouth every 7 (seven) days. 7 capsule 1   No current facility-administered medications for this visit.   Allergies: Patient has no known allergies.  Patient's last menstrual period was 10/30/2017.  Past medical history,surgical history, problem list, medications, allergies, family history and social history were all reviewed and documented as reviewed in the EPIC chart.  ROS:  Feeling well. No dyspnea or chest pain on exertion.  No abdominal pain, change in bowel habits, black or bloody stools.  No urinary tract symptoms. GYN ROS: no abnormal bleeding, pelvic pain or discharge, no breast pain or new or enlarging lumps on self exam. No neurological complaints.   OBJECTIVE:  BP 110/78 (BP Location: Right Arm, Patient Position: Sitting, Cuff Size: Normal)   Ht 5\' 1"  (1.549 m)   Wt 150 lb (68 kg)   LMP 10/30/2017   BMI 28.34 kg/m  The patient appears well, alert, oriented x 3, in no distress. ENT normal.  Neck supple. No cervical or supraclavicular adenopathy or thyromegaly.  Lungs are clear, good air entry, no wheezes, rhonchi or rales. S1 and S2 normal, no murmurs, regular rate and rhythm.  Abdomen soft without tenderness, guarding, mass or organomegaly.  Neurological is normal, no focal  findings.  BREAST EXAM: breasts appear normal, no suspicious masses, no skin or nipple changes or axillary nodes  PELVIC EXAM: VULVA: normal appearing vulva with no masses, tenderness or lesions, VAGINA: normal appearing vagina with normal color and discharge, no lesions, CERVIX: normal appearing cervix without discharge or lesions, UTERUS: uterus is normal size, shape, consistency and nontender, ADNEXA: normal adnexa in size, nontender and no masses, RECTAL: normal rectal, no masses, PAP: Pap smear done today, thin-prep method  Chaperone: Gae Gallop present during the examination  ASSESSMENT:  54 y.o. [id number] here for annual gynecologic exam  PLAN:   1.  Postmenopausal.  Mild hot flashes but no significant night sweats or mood changes.  Decreased libido noted.  Discussed that this can occur with the estrogen Caryl Comes of menopause.  Discussed option for HRT and the associated risks.  Also with vaginal atrophy symptoms likely leading to dyspareunia, we discussed the possibility of vaginal estrogen therapy with the associated risks to a lesser extent.  She would like to just monitor symptoms for now and let us know if she needs any assistance. 2. Pap smear 2014.  Pap smear is collected today.  No significant history of abnormal Pap smears.   3. Mammogram 04/2019.  Normal breast exam today.  Continue annual mammography. 4. Colonoscopy never.  She states she did recently have an appointment but had to cancel.  I encouraged her to reschedule a colonoscopy for routine cancer screening. 5. DEXA never.  As she does have risk factors including prediabetes tendencies, smaller stature, history of vitamin D deficiency,  I recommended that she schedule a DEXA scan now for baseline purposes. 6. Health maintenance.  No labs today as she normally has these completed with her primary care provider.   Return annually or sooner, prn.  Joseph Pierini MD, FACOG  05/10/19

## 2019-05-10 NOTE — Progress Notes (Signed)
lab

## 2019-05-14 LAB — PAP IG W/ RFLX HPV ASCU

## 2019-05-22 ENCOUNTER — Ambulatory Visit (AMBULATORY_SURGERY_CENTER): Payer: Self-pay | Admitting: *Deleted

## 2019-05-22 ENCOUNTER — Other Ambulatory Visit: Payer: Self-pay

## 2019-05-22 VITALS — Temp 97.1°F | Ht 60.0 in | Wt 151.0 lb

## 2019-05-22 DIAGNOSIS — Z1211 Encounter for screening for malignant neoplasm of colon: Secondary | ICD-10-CM

## 2019-05-22 DIAGNOSIS — Z01818 Encounter for other preprocedural examination: Secondary | ICD-10-CM

## 2019-05-22 MED ORDER — SUPREP BOWEL PREP KIT 17.5-3.13-1.6 GM/177ML PO SOLN: 1.0000 | Freq: Once | ORAL | 0 refills | Status: AC

## 2019-05-22 NOTE — Progress Notes (Signed)

## 2019-05-30 ENCOUNTER — Ambulatory Visit: Payer: 59 | Attending: Internal Medicine

## 2019-05-30 DIAGNOSIS — Z23 Encounter for immunization: Secondary | ICD-10-CM

## 2019-05-30 NOTE — Progress Notes (Signed)
   Covid-19 Vaccination Clinic  Name:  KYANI AGIUS    MRN: [id number] DOB: 18-Jun-1965  05/30/2019  Ms. Shaulis was observed post Covid-19 immunization for 15 minutes without incident. She was provided with Vaccine Information Sheet and instruction to access the V-Safe system.   Ms. Brandewie was instructed to call 911 with any severe reactions post vaccine: Marland Kitchen Difficulty breathing  . Swelling of face and throat  . A fast heartbeat  . A bad rash all over body  . Dizziness and weakness   Immunizations Administered    Name Date Dose VIS Date Route   Pfizer COVID-19 Vaccine 05/30/2019  4:04 PM 0.3 mL 01/26/2019 Intramuscular   Manufacturer: [place]   Lot: [id number]   [place]: [id number]

## 2019-06-01 ENCOUNTER — Other Ambulatory Visit: Payer: Self-pay | Admitting: Gastroenterology

## 2019-06-01 ENCOUNTER — Ambulatory Visit (INDEPENDENT_AMBULATORY_CARE_PROVIDER_SITE_OTHER): Payer: 59

## 2019-06-01 DIAGNOSIS — Z1159 Encounter for screening for other viral diseases: Secondary | ICD-10-CM

## 2019-06-02 LAB — SARS CORONAVIRUS 2 (TAT 6-24 HRS): SARS Coronavirus 2: NEGATIVE

## 2019-06-05 ENCOUNTER — Encounter: Payer: Self-pay | Admitting: Gastroenterology

## 2019-06-05 ENCOUNTER — Other Ambulatory Visit: Payer: Self-pay

## 2019-06-05 ENCOUNTER — Ambulatory Visit (AMBULATORY_SURGERY_CENTER): Payer: 59 | Admitting: Gastroenterology

## 2019-06-05 VITALS — BP 96/56 | HR 58 | Temp 96.0°F | Resp 18 | Ht 60.0 in | Wt 151.0 lb

## 2019-06-05 DIAGNOSIS — D123 Benign neoplasm of transverse colon: Secondary | ICD-10-CM

## 2019-06-05 DIAGNOSIS — D122 Benign neoplasm of ascending colon: Secondary | ICD-10-CM

## 2019-06-05 DIAGNOSIS — Z1211 Encounter for screening for malignant neoplasm of colon: Secondary | ICD-10-CM

## 2019-06-05 DIAGNOSIS — D124 Benign neoplasm of descending colon: Secondary | ICD-10-CM

## 2019-06-05 MED ORDER — SODIUM CHLORIDE 0.9 % IV SOLN: 500.0000 mL | Freq: Once | INTRAVENOUS | Status: DC

## 2019-06-05 NOTE — Progress Notes (Signed)
A/ox3, pleased with MAC, report to RN 

## 2019-06-05 NOTE — Progress Notes (Signed)
Called to room to assist during endoscopic procedure.  Patient ID and intended procedure confirmed with present staff. Received instructions for my participation in the procedure from the performing physician.  

## 2019-06-05 NOTE — Progress Notes (Signed)
Pt's states no medical or surgical changes since previsit or office visit.  Temp taken by JB VS taken by CW 

## 2019-06-05 NOTE — Op Note (Signed)
[facility] Patient Name: Emily Lara Procedure Date: 06/05/2019 8:59 AM MRN: [id number] Endoscopist: Milus Banister , MD Age: 54 Referring MD:  Date of Birth: 09-08-1965 Gender: Female Account #: [id number] Procedure:                Colonoscopy Indications:              Screening for colorectal malignant neoplasm Medicines:                Monitored Anesthesia Care Procedure:                Pre-Anesthesia Assessment:                           - Prior to the procedure, a History and Physical                            was performed, and patient medications and                            allergies were reviewed. The patient's tolerance of                            previous anesthesia was also reviewed. The risks                            and benefits of the procedure and the sedation                            options and risks were discussed with the patient.                            All questions were answered, and informed consent                            was obtained. Prior Anticoagulants: The patient has                            taken no previous anticoagulant or antiplatelet                            agents. ASA Grade Assessment: II - A patient with                            mild systemic disease. After reviewing the risks                            and benefits, the patient was deemed in                            satisfactory condition to undergo the procedure.                           After obtaining informed consent, the colonoscope  was passed under direct vision. Throughout the                            procedure, the patient's blood pressure, pulse, and                            oxygen saturations were monitored continuously. The                            Colonoscope was introduced through the anus and                            advanced to the the cecum, identified by                            appendiceal orifice and  ileocecal valve. The                            colonoscopy was performed without difficulty. The                            patient tolerated the procedure well. The quality                            of the bowel preparation was good. The ileocecal                            valve, appendiceal orifice, and rectum were                            photographed. Scope In: 9:04:46 AM Scope Out: 9:17:39 AM Scope Withdrawal Time: 0 hours 10 minutes 5 seconds  Total Procedure Duration: 0 hours 12 minutes 53 seconds  Findings:                 Four sessile polyps were found in the descending                            colon, transverse colon and ascending colon. The                            polyps were 2 to 7 mm in size. These polyps were                            removed with a cold snare. Resection and retrieval                            were complete.                           Internal hemorrhoids were found. The hemorrhoids                            were small.  The exam was otherwise without abnormality on                            direct and retroflexion views. Complications:            No immediate complications. Estimated blood loss:                            None. Estimated Blood Loss:     Estimated blood loss: none. Impression:               - Four 2 to 7 mm polyps in the descending colon, in                            the transverse colon and in the ascending colon,                            removed with a cold snare. Resected and retrieved.                           - Internal hemorrhoids.                           - The examination was otherwise normal on direct                            and retroflexion views. Recommendation:           - Patient has a contact number available for                            emergencies. The signs and symptoms of potential                            delayed complications were discussed with the                             patient. Return to normal activities tomorrow.                            Written discharge instructions were provided to the                            patient.                           - Resume previous diet.                           - Continue present medications.                           - Await pathology results. Milus Banister, MD 06/05/2019 9:19:43 AM This report has been signed electronically.

## 2019-06-05 NOTE — Patient Instructions (Signed)
YOU HAD AN ENDOSCOPIC PROCEDURE TODAY AT THE [facility]:   Refer to the procedure report that was given to you for any specific questions about what was found during the examination.  If the procedure report does not answer your questions, please call your gastroenterologist to clarify.  If you requested that your care partner not be given the details of your procedure findings, then the procedure report has been included in a sealed envelope for you to review at your convenience later.  YOU SHOULD EXPECT: Some feelings of bloating in the abdomen. Passage of more gas than usual.  Walking can help get rid of the air that was put into your GI tract during the procedure and reduce the bloating. If you had a lower endoscopy (such as a colonoscopy or flexible sigmoidoscopy) you may notice spotting of blood in your stool or on the toilet paper. If you underwent a bowel prep for your procedure, you may not have a normal bowel movement for a few days.  Please Note:  You might notice some irritation and congestion in your nose or some drainage.  This is from the oxygen used during your procedure.  There is no need for concern and it should clear up in a day or so.  SYMPTOMS TO REPORT IMMEDIATELY:   Following lower endoscopy (colonoscopy or flexible sigmoidoscopy):  Excessive amounts of blood in the stool  Significant tenderness or worsening of abdominal pains  Swelling of the abdomen that is new, acute  Fever of 100F or higher  For urgent or emergent issues, a gastroenterologist can be reached at any hour by calling [phone]. Do not use MyChart messaging for urgent concerns.    DIET:  We do recommend a small meal at first, but then you may proceed to your regular diet.  Drink plenty of fluids but you should avoid alcoholic beverages for 24 hours.  ACTIVITY:  You should plan to take it easy for the rest of today and you should NOT DRIVE or use heavy machinery until tomorrow (because  of the sedation medicines used during the test).    FOLLOW UP: Our staff will call the number listed on your records 48-72 hours following your procedure to check on you and address any questions or concerns that you may have regarding the information given to you following your procedure. If we do not reach you, we will leave a message.  We will attempt to reach you two times.  During this call, we will ask if you have developed any symptoms of COVID 19. If you develop any symptoms (ie: fever, flu-like symptoms, shortness of breath, cough etc.) before then, please call [phone].  If you test positive for Covid 19 in the 2 weeks post procedure, please call and report this information to us.    If any biopsies were taken you will be contacted by phone or by letter within the next 1-3 weeks.  Please call us at [phone] if you have not heard about the biopsies in 3 weeks.    SIGNATURES/CONFIDENTIALITY: You and/or your care partner have signed paperwork which will be entered into your electronic medical record.  These signatures attest to the fact that that the information above on your After Visit Summary has been reviewed and is understood.  Full responsibility of the confidentiality of this discharge information lies with you and/or your care-partner. 

## 2019-06-07 ENCOUNTER — Telehealth: Payer: Self-pay

## 2019-06-07 NOTE — Telephone Encounter (Signed)
2nd follow up call made.  NAULM 

## 2019-06-08 ENCOUNTER — Encounter: Payer: Self-pay | Admitting: Gastroenterology

## 2019-06-11 ENCOUNTER — Other Ambulatory Visit: Payer: Self-pay

## 2019-06-12 ENCOUNTER — Ambulatory Visit (INDEPENDENT_AMBULATORY_CARE_PROVIDER_SITE_OTHER): Payer: 59

## 2019-06-12 ENCOUNTER — Other Ambulatory Visit: Payer: Self-pay | Admitting: Obstetrics and Gynecology

## 2019-06-12 DIAGNOSIS — Z780 Asymptomatic menopausal state: Secondary | ICD-10-CM

## 2019-06-12 DIAGNOSIS — Z01419 Encounter for gynecological examination (general) (routine) without abnormal findings: Secondary | ICD-10-CM

## 2019-06-12 DIAGNOSIS — M8589 Other specified disorders of bone density and structure, multiple sites: Secondary | ICD-10-CM

## 2019-06-12 DIAGNOSIS — Z13820 Encounter for screening for osteoporosis: Secondary | ICD-10-CM

## 2019-06-29 ENCOUNTER — Other Ambulatory Visit: Payer: Self-pay | Admitting: Emergency Medicine

## 2019-06-29 DIAGNOSIS — E559 Vitamin D deficiency, unspecified: Secondary | ICD-10-CM

## 2019-07-02 NOTE — Telephone Encounter (Signed)
Patient is requesting a refill of the following medications: Requested Prescriptions   Pending Prescriptions Disp Refills  . Vitamin D, Ergocalciferol, (DRISDOL) 1.25 MG (50000 UNIT) CAPS capsule [Pharmacy Med Name: VITAMIN D2 50,000IU (ERGO) CAP RX] 7 capsule 1    Sig: TAKE 1 CAPSULE BY MOUTH EVERY 7 DAYS    Date of patient request: 06/29/2019 Last office visit: 03/26/2019 Date of last refill: 03/27/2019 Last refill amount: 7 capsules 1 refill  Follow up time period per chart: N/A

## 2020-04-08 ENCOUNTER — Other Ambulatory Visit: Payer: Self-pay | Admitting: Obstetrics & Gynecology

## 2020-04-08 ENCOUNTER — Other Ambulatory Visit: Payer: Self-pay | Admitting: Emergency Medicine

## 2020-04-08 DIAGNOSIS — Z1231 Encounter for screening mammogram for malignant neoplasm of breast: Secondary | ICD-10-CM

## 2020-05-12 ENCOUNTER — Ambulatory Visit
Admission: RE | Admit: 2020-05-12 | Discharge: 2020-05-12 | Disposition: A | Discharge status: Home / Self Care | Payer: 59 | Source: Ambulatory Visit

## 2020-05-12 ENCOUNTER — Other Ambulatory Visit: Payer: Self-pay

## 2020-05-12 DIAGNOSIS — Z1231 Encounter for screening mammogram for malignant neoplasm of breast: Secondary | ICD-10-CM

## 2020-06-05 ENCOUNTER — Encounter: Payer: Self-pay | Admitting: Emergency Medicine

## 2020-06-12 ENCOUNTER — Other Ambulatory Visit: Payer: Self-pay

## 2020-06-12 ENCOUNTER — Encounter: Payer: Self-pay | Admitting: Emergency Medicine

## 2020-06-12 ENCOUNTER — Ambulatory Visit (INDEPENDENT_AMBULATORY_CARE_PROVIDER_SITE_OTHER): Payer: 59 | Admitting: Emergency Medicine

## 2020-06-12 VITALS — BP 120/84 | HR 65 | Temp 98.4°F | Ht 60.0 in | Wt 149.8 lb

## 2020-06-12 DIAGNOSIS — Z13220 Encounter for screening for lipoid disorders: Secondary | ICD-10-CM

## 2020-06-12 DIAGNOSIS — E559 Vitamin D deficiency, unspecified: Secondary | ICD-10-CM | POA: Diagnosis not present

## 2020-06-12 DIAGNOSIS — Z13228 Encounter for screening for other metabolic disorders: Secondary | ICD-10-CM | POA: Diagnosis not present

## 2020-06-12 DIAGNOSIS — Z130 Encounter for screening for diseases of the blood and blood-forming organs and certain disorders involving the immune mechanism: Secondary | ICD-10-CM | POA: Diagnosis not present

## 2020-06-12 DIAGNOSIS — Z0000 Encounter for general adult medical examination without abnormal findings: Secondary | ICD-10-CM

## 2020-06-12 DIAGNOSIS — Z1329 Encounter for screening for other suspected endocrine disorder: Secondary | ICD-10-CM

## 2020-06-12 DIAGNOSIS — M79672 Pain in left foot: Secondary | ICD-10-CM

## 2020-06-12 LAB — COMPREHENSIVE METABOLIC PANEL
ALT: 34 U/L (ref 0–35)
AST: 28 U/L (ref 0–37)
Albumin: 4.8 g/dL (ref 3.5–5.2)
Alkaline Phosphatase: 60 U/L (ref 39–117)
BUN: 8 mg/dL (ref 6–23)
CO2: 29 mEq/L (ref 19–32)
Calcium: 9.9 mg/dL (ref 8.4–10.5)
Chloride: 103 mEq/L (ref 96–112)
Creatinine, Ser: 0.55 mg/dL (ref 0.40–1.20)
GFR: 105.34 mL/min (ref >60.00)
Glucose, Bld: 87 mg/dL (ref 70–99)
Potassium: 3.8 mEq/L (ref 3.5–5.1)
Sodium: 141 mEq/L (ref 135–145)
Total Bilirubin: 0.9 mg/dL (ref 0.2–1.2)
Total Protein: 8.1 g/dL (ref 6.0–8.3)

## 2020-06-12 LAB — VITAMIN D 25 HYDROXY (VIT D DEFICIENCY, FRACTURES): VITD: 35.8 ng/mL (ref 30.00–100.00)

## 2020-06-12 LAB — URIC ACID: Uric Acid, Serum: 5.5 mg/dL (ref 2.4–7.0)

## 2020-06-12 LAB — CBC WITH DIFFERENTIAL/PLATELET
Basophils Absolute: 0 10*3/uL (ref 0.0–0.1)
Basophils Relative: 0.6 % (ref 0.0–3.0)
Eosinophils Absolute: 0.3 10*3/uL (ref 0.0–0.7)
Eosinophils Relative: 4.3 % (ref 0.0–5.0)
HCT: 40.9 % (ref 36.0–46.0)
Hemoglobin: 13.8 g/dL (ref 12.0–15.0)
Lymphocytes Relative: 39.6 % (ref 12.0–46.0)
Lymphs Abs: 3 10*3/uL (ref 0.7–4.0)
MCHC: 33.7 g/dL (ref 30.0–36.0)
MCV: 93.5 fl (ref 78.0–100.0)
Monocytes Absolute: 0.5 10*3/uL (ref 0.1–1.0)
Monocytes Relative: 6.2 % (ref 3.0–12.0)
Neutro Abs: 3.7 10*3/uL (ref 1.4–7.7)
Neutrophils Relative %: 49.3 % (ref 43.0–77.0)
Platelets: 269 10*3/uL (ref 150.0–400.0)
RBC: 4.38 Mil/uL (ref 3.87–5.11)
RDW: 13.8 % (ref 11.5–15.5)
WBC: 7.5 10*3/uL (ref 4.0–10.5)

## 2020-06-12 LAB — HEMOGLOBIN A1C: Hgb A1c MFr Bld: 5.9 % (ref 4.6–6.5)

## 2020-06-12 LAB — LIPID PANEL
Cholesterol: 179 mg/dL (ref 0–200)
HDL: 57.5 mg/dL (ref >39.00)
LDL Cholesterol: 101 mg/dL — ABNORMAL HIGH (ref 0–99)
NonHDL: 121.06
Total CHOL/HDL Ratio: 3
Triglycerides: 99 mg/dL (ref 0.0–149.0)
VLDL: 19.8 mg/dL (ref 0.0–40.0)

## 2020-06-12 LAB — TSH: TSH: 1.42 u[IU]/mL (ref 0.35–4.50)

## 2020-06-12 NOTE — Progress Notes (Signed)
Emily Lara 55 y.o.   Chief Complaint  Patient presents with  . Annual Exam    HISTORY OF PRESENT ILLNESS: This is a 55 y.o. female here for annual exam. Doing well.  Has history of vitamin D deficiency. Questionable history of gout as suggested by urgent care physician. Normal mammogram on 05/12/2020. Colonoscopy done last year showed multiple noncancerous polyps and internal hemorrhoids. Normal Pap smear 05/11/2019. Occasionally gets pain to left foot.  Works long hours standing as Warehouse manager. No other complaints or medical concerns today.  HPI   Prior to Admission medications   Medication Sig Start Date End Date Taking? Authorizing Provider  Ascorbic Acid (VITAMIN C) 1000 MG tablet Take 1,000 mg by mouth daily.   Yes [provider]  Multiple Vitamin (MULTIVITAMIN) tablet Take 1 tablet by mouth daily.   Yes [provider]  VITAMIN D PO Take 2,000 Units by mouth daily.   Yes [provider]  Vitamin D, Ergocalciferol, (DRISDOL) 1.25 MG (50000 UNIT) CAPS capsule TAKE 1 CAPSULE BY MOUTH EVERY 7 DAYS Patient not taking: Reported on 06/12/2020 07/02/19   Horald Pollen, MD    Not on File  Patient Active Problem List   Diagnosis Date Noted  . Vitamin D insufficiency 07/26/2015    Past Medical History:  Diagnosis Date  . Allergy   . GERD (gastroesophageal reflux disease)   . Pre-diabetes    diet controlled   . Vitamin D deficiency     Past Surgical History:  Procedure Laterality Date  . CESAREAN SECTION  2007    Social History   Socioeconomic History  . Marital status: Married    Spouse name: Not on file  . Number of children: Not on file  . Years of education: Not on file  . Highest education level: Not on file  Occupational History  . Not on file  Tobacco Use  . Smoking status: Never Smoker  . Smokeless tobacco: Never Used  Vaping Use  . Vaping Use: Never used  Substance and Sexual Activity  .  Alcohol use: No    Alcohol/week: 0.0 standard drinks  . Drug use: No  . Sexual activity: Not Currently    Partners: Male    Comment: 1st intercourse 73 yo-1 partner  Other Topics Concern  . Not on file  Social History Narrative  . Not on file   Social Determinants of Health   Financial Resource Strain: Not on file  Food Insecurity: Not on file  Transportation Needs: Not on file  Physical Activity: Not on file  Stress: Not on file  Social Connections: Not on file  Intimate Partner Violence: Not on file    Family History  Problem Relation Age of Onset  . Hyperlipidemia Father   . Thyroid disease Father   . Alcohol abuse Neg Hx   . Cancer Neg Hx   . COPD Neg Hx   . Depression Neg Hx   . Diabetes Neg Hx   . Drug abuse Neg Hx   . Early death Neg Hx   . Hearing loss Neg Hx   . Heart disease Neg Hx   . Hypertension Neg Hx   . Kidney disease Neg Hx   . Stroke Neg Hx   . Colon cancer Neg Hx   . Colon polyps Neg Hx   . Esophageal cancer Neg Hx   . Rectal cancer Neg Hx   . Stomach cancer Neg Hx      Review  of Systems  Constitutional: Negative.  Negative for chills and fever.  HENT: Negative.  Negative for congestion and sore throat.   Respiratory: Negative.  Negative for cough and shortness of breath.   Cardiovascular: Negative.  Negative for chest pain and palpitations.  Gastrointestinal: Negative.  Negative for abdominal pain, blood in stool, diarrhea, melena, nausea and vomiting.  Genitourinary: Negative.  Negative for dysuria and hematuria.  Musculoskeletal: Negative.  Negative for back pain, myalgias and neck pain.       Occasional left foot pain  Skin: Negative.  Negative for rash.  Neurological: Negative.  Negative for dizziness and headaches.  All other systems reviewed and are negative.    Today's Vitals   06/12/20 1358  BP: 120/84  Pulse: 65  Temp: 98.4 F (36.9 C)  TempSrc: Oral  SpO2: 96%  Weight: 149 lb 12.8 oz (67.9 kg)  Height: 5' (1.524 m)    Body mass index is 29.26 kg/m.   Physical Exam Vitals reviewed.  Constitutional:      Appearance: Normal appearance.  HENT:     Head: Normocephalic.     Right Ear: Tympanic membrane, ear canal and external ear normal.     Left Ear: Tympanic membrane, ear canal and external ear normal.     Nose: Nose normal.     Mouth/Throat:     Mouth: Mucous membranes are moist.     Pharynx: Oropharynx is clear.  Eyes:     Extraocular Movements: Extraocular movements intact.     Conjunctiva/sclera: Conjunctivae normal.     Pupils: Pupils are equal, round, and reactive to light.  Cardiovascular:     Rate and Rhythm: Normal rate and regular rhythm.     Pulses: Normal pulses.     Heart sounds: Normal heart sounds.  Pulmonary:     Effort: Pulmonary effort is normal.     Breath sounds: Normal breath sounds.  Abdominal:     General: Bowel sounds are normal. There is no distension.     Palpations: Abdomen is soft.     Tenderness: There is no abdominal tenderness.  Musculoskeletal:        General: Normal range of motion.     Cervical back: Normal range of motion and neck supple.  Skin:    General: Skin is warm and dry.     Capillary Refill: Capillary refill takes less than 2 seconds.  Neurological:     General: No focal deficit present.     Mental Status: She is alert and oriented to person, place, and time.  Psychiatric:        Mood and Affect: Mood normal.        Behavior: Behavior normal.      ASSESSMENT & PLAN: Akaiya was seen today for annual exam.  Diagnoses and all orders for this visit:  Routine general medical examination at a health care facility  Screening for deficiency anemia -     CBC with Differential/Platelet  Screening for lipoid disorders -     Lipid panel  Screening for endocrine, metabolic and immunity disorder -     Uric acid -     Comprehensive metabolic panel -     Hemoglobin A1c -     TSH  Left foot pain -     Ambulatory referral to  Podiatry  Vitamin D deficiency -     VITAMIN D 25 Hydroxy (Vit-D Deficiency, Fractures)   Modifiable risk factors discussed with patient. Anticipatory guidance provided. The following topics were  discussed: Smoking Diet and nutrition Benefits of exercise Cancer screening: Results of mammogram and colonoscopy reviewed with patient Vaccinations Cardiovascular risk assessment Mental health including depression and anxiety Fall and accident prevention   Patient Instructions     Health Maintenance, Female Adopting a healthy lifestyle and getting preventive care are important in promoting health and wellness. Ask your health care provider about:  The right schedule for you to have regular tests and exams.  Things you can do on your own to prevent diseases and keep yourself healthy. What should I know about diet, weight, and exercise? Eat a healthy diet  Eat a diet that includes plenty of vegetables, fruits, low-fat dairy products, and lean protein.  Do not eat a lot of foods that are high in solid fats, added sugars, or sodium.   Maintain a healthy weight Body mass index (BMI) is used to identify weight problems. It estimates body fat based on height and weight. Your health care provider can help determine your BMI and help you achieve or maintain a healthy weight. Get regular exercise Get regular exercise. This is one of the most important things you can do for your health. Most adults should:  Exercise for at least 150 minutes each week. The exercise should increase your heart rate and make you sweat (moderate-intensity exercise).  Do strengthening exercises at least twice a week. This is in addition to the moderate-intensity exercise.  Spend less time sitting. Even light physical activity can be beneficial. Watch cholesterol and blood lipids Have your blood tested for lipids and cholesterol at 55 years of age, then have this test every 5 years. Have your cholesterol levels  checked more often if:  Your lipid or cholesterol levels are high.  You are older than 53 years of age.  You are at high risk for heart disease. What should I know about cancer screening? Depending on your health history and family history, you may need to have cancer screening at various ages. This may include screening for:  Breast cancer.  Cervical cancer.  Colorectal cancer.  Skin cancer.  Lung cancer. What should I know about heart disease, diabetes, and high blood pressure? Blood pressure and heart disease  High blood pressure causes heart disease and increases the risk of stroke. This is more likely to develop in people who have high blood pressure readings, are of African descent, or are overweight.  Have your blood pressure checked: ? Every 3-5 years if you are 79-55 years of age. ? Every year if you are 76 years old or older. Diabetes Have regular diabetes screenings. This checks your fasting blood sugar level. Have the screening done:  Once every three years after age 43 if you are at a normal weight and have a low risk for diabetes.  More often and at a younger age if you are overweight or have a high risk for diabetes. What should I know about preventing infection? Hepatitis B If you have a higher risk for hepatitis B, you should be screened for this virus. Talk with your health care provider to find out if you are at risk for hepatitis B infection. Hepatitis C Testing is recommended for:  Everyone born from [birth year] through 1963.  Anyone with known risk factors for hepatitis C. Sexually transmitted infections (STIs)  Get screened for STIs, including gonorrhea and chlamydia, if: ? You are sexually active and are younger than 53 years of age. ? You are older than 53 years of age and your health  care provider tells you that you are at risk for this type of infection. ? Your sexual activity has changed since you were last screened, and you are at increased risk  for chlamydia or gonorrhea. Ask your health care provider if you are at risk.  Ask your health care provider about whether you are at high risk for HIV. Your health care provider may recommend a prescription medicine to help prevent HIV infection. If you choose to take medicine to prevent HIV, you should first get tested for HIV. You should then be tested every 3 months for as long as you are taking the medicine. Pregnancy  If you are about to stop having your period (premenopausal) and you may become pregnant, seek counseling before you get pregnant.  Take 400 to 800 micrograms (mcg) of folic acid every day if you become pregnant.  Ask for birth control (contraception) if you want to prevent pregnancy. Osteoporosis and menopause Osteoporosis is a disease in which the bones lose minerals and strength with aging. This can result in bone fractures. If you are 35 years old or older, or if you are at risk for osteoporosis and fractures, ask your health care provider if you should:  Be screened for bone loss.  Take a calcium or vitamin D supplement to lower your risk of fractures.  Be given hormone replacement therapy (HRT) to treat symptoms of menopause. Follow these instructions at home: Lifestyle  Do not use any products that contain nicotine or tobacco, such as cigarettes, e-cigarettes, and chewing tobacco. If you need help quitting, ask your health care provider.  Do not use street drugs.  Do not share needles.  Ask your health care provider for help if you need support or information about quitting drugs. Alcohol use  Do not drink alcohol if: ? Your health care provider tells you not to drink. ? You are pregnant, may be pregnant, or are planning to become pregnant.  If you drink alcohol: ? Limit how much you use to 0-1 drink a day. ? Limit intake if you are breastfeeding.  Be aware of how much alcohol is in your drink. In the [place] one drink equals one 12 oz bottle of beer (355  mL), one 5 oz glass of wine (148 mL), or one 1 oz glass of hard liquor (44 mL). General instructions  Schedule regular health, dental, and eye exams.  Stay current with your vaccines.  Tell your health care provider if: ? You often feel depressed. ? You have ever been abused or do not feel safe at home. Summary  Adopting a healthy lifestyle and getting preventive care are important in promoting health and wellness.  Follow your health care provider's instructions about healthy diet, exercising, and getting tested or screened for diseases.  Follow your health care provider's instructions on monitoring your cholesterol and blood pressure. This information is not intended to replace advice given to you by your health care provider. Make sure you discuss any questions you have with your health care provider. Document Revised: 01/25/2018 Document Reviewed: 01/25/2018 Elsevier Patient Education  2021 [place], MD [facility]

## 2020-06-12 NOTE — Patient Instructions (Signed)
Health Maintenance, Female Adopting a healthy lifestyle and getting preventive care are important in promoting health and wellness. Ask your health care provider about:  The right schedule for you to have regular tests and exams.  Things you can do on your own to prevent diseases and keep yourself healthy. What should I know about diet, weight, and exercise? Eat a healthy diet  Eat a diet that includes plenty of vegetables, fruits, low-fat dairy products, and lean protein.  Do not eat a lot of foods that are high in solid fats, added sugars, or sodium.   Maintain a healthy weight Body mass index (BMI) is used to identify weight problems. It estimates body fat based on height and weight. Your health care provider can help determine your BMI and help you achieve or maintain a healthy weight. Get regular exercise Get regular exercise. This is one of the most important things you can do for your health. Most adults should:  Exercise for at least 150 minutes each week. The exercise should increase your heart rate and make you sweat (moderate-intensity exercise).  Do strengthening exercises at least twice a week. This is in addition to the moderate-intensity exercise.  Spend less time sitting. Even light physical activity can be beneficial. Watch cholesterol and blood lipids Have your blood tested for lipids and cholesterol at 53 years of age, then have this test every 5 years. Have your cholesterol levels checked more often if:  Your lipid or cholesterol levels are high.  You are older than 53 years of age.  You are at high risk for heart disease. What should I know about cancer screening? Depending on your health history and family history, you may need to have cancer screening at various ages. This may include screening for:  Breast cancer.  Cervical cancer.  Colorectal cancer.  Skin cancer.  Lung cancer. What should I know about heart disease, diabetes, and high blood  pressure? Blood pressure and heart disease  High blood pressure causes heart disease and increases the risk of stroke. This is more likely to develop in people who have high blood pressure readings, are of African descent, or are overweight.  Have your blood pressure checked: ? Every 3-5 years if you are 18-39 years of age. ? Every year if you are 40 years old or older. Diabetes Have regular diabetes screenings. This checks your fasting blood sugar level. Have the screening done:  Once every three years after age 40 if you are at a normal weight and have a low risk for diabetes.  More often and at a younger age if you are overweight or have a high risk for diabetes. What should I know about preventing infection? Hepatitis B If you have a higher risk for hepatitis B, you should be screened for this virus. Talk with your health care provider to find out if you are at risk for hepatitis B infection. Hepatitis C Testing is recommended for:  Everyone born from 1945 through 1965.  Anyone with known risk factors for hepatitis C. Sexually transmitted infections (STIs)  Get screened for STIs, including gonorrhea and chlamydia, if: ? You are sexually active and are younger than 53 years of age. ? You are older than 53 years of age and your health care provider tells you that you are at risk for this type of infection. ? Your sexual activity has changed since you were last screened, and you are at increased risk for chlamydia or gonorrhea. Ask your health care provider   if you are at risk.  Ask your health care provider about whether you are at high risk for HIV. Your health care provider may recommend a prescription medicine to help prevent HIV infection. If you choose to take medicine to prevent HIV, you should first get tested for HIV. You should then be tested every 3 months for as long as you are taking the medicine. Pregnancy  If you are about to stop having your period (premenopausal) and  you may become pregnant, seek counseling before you get pregnant.  Take 400 to 800 micrograms (mcg) of folic acid every day if you become pregnant.  Ask for birth control (contraception) if you want to prevent pregnancy. Osteoporosis and menopause Osteoporosis is a disease in which the bones lose minerals and strength with aging. This can result in bone fractures. If you are 65 years old or older, or if you are at risk for osteoporosis and fractures, ask your health care provider if you should:  Be screened for bone loss.  Take a calcium or vitamin D supplement to lower your risk of fractures.  Be given hormone replacement therapy (HRT) to treat symptoms of menopause. Follow these instructions at home: Lifestyle  Do not use any products that contain nicotine or tobacco, such as cigarettes, e-cigarettes, and chewing tobacco. If you need help quitting, ask your health care provider.  Do not use street drugs.  Do not share needles.  Ask your health care provider for help if you need support or information about quitting drugs. Alcohol use  Do not drink alcohol if: ? Your health care provider tells you not to drink. ? You are pregnant, may be pregnant, or are planning to become pregnant.  If you drink alcohol: ? Limit how much you use to 0-1 drink a day. ? Limit intake if you are breastfeeding.  Be aware of how much alcohol is in your drink. In the [place] one drink equals one 12 oz bottle of beer (355 mL), one 5 oz glass of wine (148 mL), or one 1 oz glass of hard liquor (44 mL). General instructions  Schedule regular health, dental, and eye exams.  Stay current with your vaccines.  Tell your health care provider if: ? You often feel depressed. ? You have ever been abused or do not feel safe at home. Summary  Adopting a healthy lifestyle and getting preventive care are important in promoting health and wellness.  Follow your health care provider's instructions about healthy  diet, exercising, and getting tested or screened for diseases.  Follow your health care provider's instructions on monitoring your cholesterol and blood pressure. This information is not intended to replace advice given to you by your health care provider. Make sure you discuss any questions you have with your health care provider. Document Revised: 01/25/2018 Document Reviewed: 01/25/2018 Elsevier Patient Education  2021 Elsevier Inc.  

## 2020-07-16 ENCOUNTER — Encounter: Payer: Self-pay | Admitting: Podiatry

## 2020-08-12 ENCOUNTER — Other Ambulatory Visit: Payer: Self-pay

## 2020-08-12 ENCOUNTER — Ambulatory Visit (INDEPENDENT_AMBULATORY_CARE_PROVIDER_SITE_OTHER): Payer: 59

## 2020-08-12 ENCOUNTER — Ambulatory Visit: Payer: 59 | Admitting: Podiatry

## 2020-08-12 ENCOUNTER — Ambulatory Visit (INDEPENDENT_AMBULATORY_CARE_PROVIDER_SITE_OTHER): Payer: 59 | Admitting: Podiatry

## 2020-08-12 DIAGNOSIS — M722 Plantar fascial fibromatosis: Secondary | ICD-10-CM | POA: Diagnosis not present

## 2020-08-12 MED ORDER — MELOXICAM 15 MG PO TABS: 15.0000 mg | ORAL_TABLET | Freq: Every day | ORAL | 3 refills | Status: DC

## 2020-08-12 NOTE — Patient Instructions (Signed)

## 2020-08-13 NOTE — Progress Notes (Signed)
  Subjective:  Patient ID: Emily Lara, female    DOB: [birth year],  MRN: [id number]  Chief Complaint  Patient presents with   Pain    Pain at Lt plantar foot x 1 yr -worse with first step in AM - noinjury -10/10 sharp pain -pain radiates up the leg Tx: epsom salt and massage -pt states Rt foot is acting the same     55 y.o. female presents with the above complaint. History confirmed with patient.   Objective:  Physical Exam: warm, good capillary refill, no trophic changes or ulcerative lesions, normal DP and PT pulses, and normal sensory exam. Left Foot: point tenderness over the heel pad  Right Foot: point tenderness over the heel pad   No images are attached to the encounter.  Radiographs: Multiple views x-ray of both feet: no fracture, dislocation, swelling or degenerative changes noted and plantar calcaneal spur Assessment:   1. Bilateral plantar fasciitis      Plan:  Patient was evaluated and treated and all questions answered.  Discussed the etiology and treatment options for plantar fasciitis including stretching, formal physical therapy, supportive shoegears such as a running shoe or sneaker, pre fabricated orthoses, injection therapy, and oral medications. We also discussed the role of surgical treatment of this for patients who do not improve after exhausting non-surgical treatment options.   -XR reviewed with patient -Educated patient on stretching and icing of the affected limb -Injection delivered to the plantar fascia of both feet. -Rx for meloxicam. Educated on use, risks and benefits of the medication  After sterile prep with povidone-iodine solution and alcohol, the bilateral heel was injected with 0.5cc 2% xylocaine plain, 0.5cc 0.5% marcaine plain, 5mg  triamcinolone acetonide, and 2mg  dexamethasone was injected along the medial plantar fascia at the insertion on the plantar calcaneus. The patient tolerated the procedure well without  complication.  Return if symptoms worsen or fail to improve by 6 weeks.

## 2020-09-02 ENCOUNTER — Encounter: Payer: Self-pay | Admitting: Emergency Medicine

## 2020-09-02 ENCOUNTER — Other Ambulatory Visit: Payer: Self-pay

## 2020-09-02 ENCOUNTER — Ambulatory Visit (INDEPENDENT_AMBULATORY_CARE_PROVIDER_SITE_OTHER): Payer: 59 | Admitting: Emergency Medicine

## 2020-09-02 VITALS — BP 124/86 | HR 61 | Temp 97.9°F | Ht 60.0 in | Wt 152.0 lb

## 2020-09-02 DIAGNOSIS — M25562 Pain in left knee: Secondary | ICD-10-CM | POA: Diagnosis not present

## 2020-09-02 MED ORDER — TRAMADOL HCL 50 MG PO TABS
50.0000 mg | ORAL_TABLET | Freq: Three times a day (TID) | ORAL | 0 refills | Status: AC | PRN
Start: 2020-09-02 — End: 2020-09-07

## 2020-09-02 NOTE — Patient Instructions (Signed)
Acute Knee Pain, Adult °Many things can cause knee pain. Sometimes, knee pain is sudden (acute) and may be caused by damage, swelling, or irritation of the muscles and tissues that support your knee. °The pain often goes away on its own with time and rest. If the pain does not go away, tests may be done to find out what is causing the pain. °Follow these instructions at home: °If you have a knee sleeve or brace: ° °Wear the knee sleeve or brace as told by your doctor. Take it off only as told by your doctor. °Loosen it if your toes: °Tingle. °Become numb. °Turn cold and blue. °Keep it clean. °If the knee sleeve or brace is not waterproof: °Do not let it get wet. °Cover it with a watertight covering when you take a bath or shower. °Activity °Rest your knee. °Do not do things that cause pain or make pain worse. °Avoid activities where both feet leave the ground at the same time (high-impact activities). Examples are running, jumping rope, and doing jumping jacks. °Work with a physical therapist to make a safe exercise program, as told by your doctor. °Managing pain, stiffness, and swelling ° °If told, put ice on the knee. To do this: °If you have a removable knee sleeve or brace, take it off as told by your doctor. °Put ice in a plastic bag. °Place a towel between your skin and the bag. °Leave the ice on for 20 minutes, 2-3 times a day. °Take off the ice if your skin turns bright red. This is very important. If you cannot feel pain, heat, or cold, you have a greater risk of damage to the area. °If told, use an elastic bandage to put pressure (compression) on your injured knee. °Raise your knee above the level of your heart while you are sitting or lying down. °Sleep with a pillow under your knee. °General instructions °Take over-the-counter and prescription medicines only as told by your doctor. °Do not smoke or use any products that contain nicotine or tobacco. If you need help quitting, ask your doctor. °If you are  overweight, work with your doctor and a food expert (dietitian) to set goals to lose weight. Being overweight can make your knee hurt more. °Watch for any changes in your symptoms. °Keep all follow-up visits. °Contact a doctor if: °The knee pain does not stop. °The knee pain changes or gets worse. °You have a fever along with knee pain. °Your knee is red or feels warm when you touch it. °Your knee gives out or locks up. °Get help right away if: °Your knee swells, and the swelling gets worse. °You cannot move your knee. °You have very bad knee pain that does not get better with pain medicine. °Summary °Many things can cause knee pain. The pain often goes away on its own with time and rest. °Your doctor may do tests to find out the cause of the pain. °Watch for any changes in your symptoms. Relieve your pain with rest, medicines, light activity, and use of ice. °Get help right away if you cannot move your knee or your knee pain is very bad. °This information is not intended to replace advice given to you by your health care provider. Make sure you discuss any questions you have with your health care provider. °Document Revised: 07/18/2019 Document Reviewed: 07/18/2019 °Elsevier Patient Education © 2022 Elsevier Inc. ° °

## 2020-09-02 NOTE — Progress Notes (Signed)
Emily Lara 55 y.o.   Chief Complaint  Patient presents with   Knee Pain    Left knee, x 1wk    HISTORY OF PRESENT ILLNESS: This is a 55 y.o. female complaining of sharp pain to left knee that started 1 week ago.  Denies direct injury. Went to urgent care center and told she has osteoarthritis of left knee.  Had x-rays done.  Prescribed corticosteroids but not helping much. No other complaints or medical concerns today.  Knee Pain     Prior to Admission medications   Medication Sig Start Date End Date Taking? Authorizing Provider  Ascorbic Acid (VITAMIN C) 1000 MG tablet Take 1,000 mg by mouth daily.   Yes [provider]  meloxicam (MOBIC) 15 MG tablet Take 1 tablet (15 mg total) by mouth daily. 08/12/20  Yes Lara, Emily Minister, DPM  Multiple Vitamin (MULTIVITAMIN) tablet Take 1 tablet by mouth daily.   Yes [provider]  predniSONE (DELTASONE) 5 MG tablet Take 5 mg by mouth daily with breakfast.   Yes [provider]  VITAMIN D PO Take 2,000 Units by mouth daily.   Yes [provider]  Vitamin D, Ergocalciferol, (DRISDOL) 1.25 MG (50000 UNIT) CAPS capsule TAKE 1 CAPSULE BY MOUTH EVERY 7 DAYS Patient not taking: No sig reported 07/02/19   Emily Pollen, MD    Not on File  Patient Active Problem List   Diagnosis Date Noted   Vitamin D insufficiency 07/26/2015    Past Medical History:  Diagnosis Date   Allergy    GERD (gastroesophageal reflux disease)    Pre-diabetes    diet controlled    Vitamin D deficiency     Past Surgical History:  Procedure Laterality Date   CESAREAN SECTION  2007    Social History   Socioeconomic History   Marital status: Married    Spouse name: Not on file   Number of children: Not on file   Years of education: Not on file   Highest education level: Not on file  Occupational History   Not on file  Tobacco Use   Smoking status: Never   Smokeless tobacco: Never  Vaping Use    Vaping Use: Never used  Substance and Sexual Activity   Alcohol use: No    Alcohol/week: 0.0 standard drinks   Drug use: No   Sexual activity: Not Currently    Partners: Male    Comment: 1st intercourse 9 yo-1 partner  Other Topics Concern   Not on file  Social History Narrative   Not on file   Social Determinants of Health   Financial Resource Strain: Not on file  Food Insecurity: Not on file  Transportation Needs: Not on file  Physical Activity: Not on file  Stress: Not on file  Social Connections: Not on file  Intimate Partner Violence: Not on file    Family History  Problem Relation Age of Onset   Hyperlipidemia Father    Thyroid disease Father    Alcohol abuse Neg Hx    Cancer Neg Hx    COPD Neg Hx    Depression Neg Hx    Diabetes Neg Hx    Drug abuse Neg Hx    Early death Neg Hx    Hearing loss Neg Hx    Heart disease Neg Hx    Hypertension Neg Hx    Kidney disease Neg Hx    Stroke Neg Hx    Colon cancer Neg Hx  Colon polyps Neg Hx    Esophageal cancer Neg Hx    Rectal cancer Neg Hx    Stomach cancer Neg Hx      Review of Systems  Constitutional: Negative.  Negative for chills and fever.  HENT: Negative.  Negative for congestion and sore throat.   Respiratory: Negative.  Negative for cough and shortness of breath.   Cardiovascular: Negative.  Negative for chest pain and palpitations.  Gastrointestinal:  Negative for abdominal pain, diarrhea, nausea and vomiting.  Genitourinary:  Negative for dysuria and hematuria.  Musculoskeletal:  Positive for joint pain.  Skin: Negative.  Negative for rash.  Neurological:  Negative for dizziness and headaches.  All other systems reviewed and are negative.   Physical Exam Vitals reviewed.  Constitutional:      Appearance: Normal appearance.  HENT:     Head: Normocephalic.  Eyes:     Extraocular Movements: Extraocular movements intact.     Pupils: Pupils are equal, round, and reactive to light.   Cardiovascular:     Rate and Rhythm: Normal rate.  Pulmonary:     Effort: Pulmonary effort is normal.  Musculoskeletal:     Cervical back: Normal range of motion.     Comments: Left knee: No erythema or ecchymosis.  Mild medial tenderness.  Full range of motion.  Stable in flexion and extension.  No significant swelling.  Skin:    General: Skin is warm and dry.     Capillary Refill: Capillary refill takes less than 2 seconds.  Neurological:     General: No focal deficit present.     Mental Status: She is alert and oriented to person, place, and time.  Psychiatric:        Mood and Affect: Mood normal.        Behavior: Behavior normal.     ASSESSMENT & PLAN: Emily Lara was seen today for knee pain.  Diagnoses and all orders for this visit:  Acute pain of left knee -     Ambulatory referral to Orthopedic Surgery -     traMADol (ULTRAM) 50 MG tablet; Take 1 tablet (50 mg total) by mouth every 8 (eight) hours as needed for up to 5 days for severe pain.  Patient Instructions  Acute Knee Pain, Adult Many things can cause knee pain. Sometimes, knee pain is sudden (acute) and may be caused by damage, swelling, or irritation of the muscles andtissues that support your knee. The pain often goes away on its own with time and rest. If the pain does not goaway, tests may be done to find out what is causing the pain. Follow these instructions at home: If you have a knee sleeve or brace:  Wear the knee sleeve or brace as told by your doctor. Take it off only as told by your doctor. Loosen it if your toes: Tingle. Become numb. Turn cold and blue. Keep it clean. If the knee sleeve or brace is not waterproof: Do not let it get wet. Cover it with a watertight covering when you take a bath or shower.  Activity Rest your knee. Do not do things that cause pain or make pain worse. Avoid activities where both feet leave the ground at the same time (high-impact activities). Examples are running,  jumping rope, and doing jumping jacks. Work with a physical therapist to make a safe exercise program, as told by your doctor. Managing pain, stiffness, and swelling  If told, put ice on the knee. To do this: If you  have a removable knee sleeve or brace, take it off as told by your doctor. Put ice in a plastic bag. Place a towel between your skin and the bag. Leave the ice on for 20 minutes, 2-3 times a day. Take off the ice if your skin turns bright red. This is very important. If you cannot feel pain, heat, or cold, you have a greater risk of damage to the area. If told, use an elastic bandage to put pressure (compression) on your injured knee. Raise your knee above the level of your heart while you are sitting or lying down. Sleep with a pillow under your knee.  General instructions Take over-the-counter and prescription medicines only as told by your doctor. Do not smoke or use any products that contain nicotine or tobacco. If you need help quitting, ask your doctor. If you are overweight, work with your doctor and a food expert (dietitian) to set goals to lose weight. Being overweight can make your knee hurt more. Watch for any changes in your symptoms. Keep all follow-up visits. Contact a doctor if: The knee pain does not stop. The knee pain changes or gets worse. You have a fever along with knee pain. Your knee is red or feels warm when you touch it. Your knee gives out or locks up. Get help right away if: Your knee swells, and the swelling gets worse. You cannot move your knee. You have very bad knee pain that does not get better with pain medicine. Summary Many things can cause knee pain. The pain often goes away on its own with time and rest. Your doctor may do tests to find out the cause of the pain. Watch for any changes in your symptoms. Relieve your pain with rest, medicines, light activity, and use of ice. Get help right away if you cannot move your knee or your knee  pain is very bad. This information is not intended to replace advice given to you by your health care provider. Make sure you discuss any questions you have with your healthcare provider. Document Revised: 07/18/2019 Document Reviewed: 07/18/2019 Elsevier Patient Education  2022 [place], MD [facility]

## 2020-09-19 ENCOUNTER — Ambulatory Visit: Payer: 59 | Admitting: Orthopaedic Surgery

## 2020-09-19 ENCOUNTER — Telehealth: Payer: Self-pay | Admitting: Emergency Medicine

## 2020-09-19 MED ORDER — NIRMATRELVIR/RITONAVIR (PAXLOVID)TABLET: 3.0000 | ORAL_TABLET | Freq: Two times a day (BID) | ORAL | 0 refills | Status: AC

## 2020-09-19 NOTE — Telephone Encounter (Signed)
Please advise, Dr. Mitchel Honour out of office 09/19/20.

## 2020-09-19 NOTE — Telephone Encounter (Signed)
Ok done erx 

## 2020-09-19 NOTE — Telephone Encounter (Signed)
COVID+ 09/18/20 symptoms started 09/15/20  cough, sore throat, body/head aches, dizziness  Would like paxlovid sent over to pharmacy:  COVID+ 09/18/20 symptoms 09/15/20 [facility] [place], [place] AT [address]  Phone:  [phone] Fax:  [phone]

## 2020-09-19 NOTE — Telephone Encounter (Signed)
Patient notified

## 2020-09-19 NOTE — Telephone Encounter (Signed)
Done erx 

## 2020-09-30 ENCOUNTER — Encounter: Payer: Self-pay | Admitting: Orthopaedic Surgery

## 2020-09-30 ENCOUNTER — Other Ambulatory Visit: Payer: Self-pay

## 2020-09-30 ENCOUNTER — Ambulatory Visit (INDEPENDENT_AMBULATORY_CARE_PROVIDER_SITE_OTHER): Payer: 59 | Admitting: Orthopaedic Surgery

## 2020-09-30 ENCOUNTER — Ambulatory Visit: Payer: Self-pay

## 2020-09-30 VITALS — BP 146/87 | HR 87 | Ht 59.0 in | Wt 156.0 lb

## 2020-09-30 DIAGNOSIS — M25562 Pain in left knee: Secondary | ICD-10-CM

## 2020-10-06 ENCOUNTER — Ambulatory Visit
Admission: RE | Admit: 2020-10-06 | Discharge: 2020-10-06 | Disposition: A | Discharge status: Home / Self Care | Payer: 59 | Source: Ambulatory Visit | Attending: Orthopaedic Surgery | Admitting: Orthopaedic Surgery

## 2020-10-06 ENCOUNTER — Other Ambulatory Visit: Payer: Self-pay

## 2020-10-06 DIAGNOSIS — M25562 Pain in left knee: Secondary | ICD-10-CM

## 2020-10-07 ENCOUNTER — Telehealth: Payer: Self-pay

## 2020-10-07 NOTE — Telephone Encounter (Signed)
Patient scheduled for ROV 10/14/2020 at 2:15p.

## 2020-10-07 NOTE — Telephone Encounter (Signed)
FYI-  MRI results for left knee are in patient's chart.  Please advise.  Thank you.

## 2020-10-07 NOTE — Telephone Encounter (Signed)
Please see MRI report and advise.

## 2020-10-09 NOTE — Progress Notes (Signed)
Office Visit Note   Patient: Emily Lara           Date of Birth: 02-May-1965           MRN: [id number] Visit Date: 09/30/2020              Requested by: Horald Pollen, [place],  [place] [address] PCP: Horald Pollen, MD   Assessment & Plan: Visit Diagnoses:  1. Left knee pain, unspecified chronicity     Plan: Patient with severe knee pain with pain with activities of daily living.  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  With sharp pain and popping symptoms she has had I would recommend proceeding with an MRI scan of her left knee.  Currently she is having problems is making it to the grocery store.  Her x-rays did not show any significant osteoarthritis.  Follow-Up Instructions: Return after left knee MRI scan rule out medial meniscal tear.  Orders:  Orders Placed This Encounter  Procedures   XR KNEE 3 VIEW LEFT   MR Knee Left w/o contrast   No orders of the defined types were placed in this encounter.     Procedures: No procedures performed   Clinical Data: No additional findings.   Subjective: Chief Complaint  Patient presents with   Left Knee - New Patient (Initial Visit)    HPI 55 year old female new patient visit with complaints of knee pain which she rates as 10 out of 10.  She states she has increased pain with walking increased pain with sitting going from sitting to standing.  She sometimes describes it as sharp stabbing sharp pain even when she lays down.  No previous surgeries to her knee.  She has used over-the-counter Tylenol for her pain.  She works at [employer] 5 days/week 8 hours standing.  Patient's Freight forwarder.  No past history of knee injury that she can recall.  She states the pain on the medial aspect of her knee when it occurs is sharp stabbing and points over the medial meniscus.  Review of Systems negative for gout for other rheumatologic conditions no other joint symptoms never  getting fever or chills.   Objective: Vital Signs: BP (!) 146/87 (BP Location: Right Arm, Patient Position: Sitting, Cuff Size: Normal)   Pulse 87   Ht '4\' 11"'$  (1.499 m)   Wt 156 lb (70.8 kg)   LMP 10/30/2017   SpO2 97%   BMI 31.51 kg/m   Physical Exam Constitutional:      Appearance: She is well-developed.  HENT:     Head: Normocephalic.     Right Ear: External ear normal.     Left Ear: External ear normal. There is no impacted cerumen.  Eyes:     Pupils: Pupils are equal, round, and reactive to light.  Neck:     Thyroid: No thyromegaly.     Trachea: No tracheal deviation.  Cardiovascular:     Rate and Rhythm: Normal rate.  Pulmonary:     Effort: Pulmonary effort is normal.  Abdominal:     Palpations: Abdomen is soft.  Musculoskeletal:     Cervical back: No rigidity.  Skin:    General: Skin is warm and dry.  Neurological:     Mental Status: She is alert and oriented to person, place, and time.  Psychiatric:        Behavior: Behavior normal.    Ortho Exam patient ambulates with a  left knee limp.  She has exquisite medial joint line tenderness medial joint line pain with hyperextension.  ACL PCL exam is normal.  No palpable Baker's cyst.  Negative logroll of the hips distal pulses are intact.  Specialty Comments:  No specialty comments available.  Imaging: No results found.   PMFS History: Patient Active Problem List   Diagnosis Date Noted   Vitamin D insufficiency 07/26/2015   Past Medical History:  Diagnosis Date   Allergy    GERD (gastroesophageal reflux disease)    Pre-diabetes    diet controlled    Vitamin D deficiency     Family History  Problem Relation Age of Onset   Hyperlipidemia Father    Thyroid disease Father    Alcohol abuse Neg Hx    Cancer Neg Hx    COPD Neg Hx    Depression Neg Hx    Diabetes Neg Hx    Drug abuse Neg Hx    Early death Neg Hx    Hearing loss Neg Hx    Heart disease Neg Hx    Hypertension Neg Hx    Kidney  disease Neg Hx    Stroke Neg Hx    Colon cancer Neg Hx    Colon polyps Neg Hx    Esophageal cancer Neg Hx    Rectal cancer Neg Hx    Stomach cancer Neg Hx     Past Surgical History:  Procedure Laterality Date   CESAREAN SECTION  2007   Social History   Occupational History   Not on file  Tobacco Use   Smoking status: Never   Smokeless tobacco: Never  Vaping Use   Vaping Use: Never used  Substance and Sexual Activity   Alcohol use: No    Alcohol/week: 0.0 standard drinks   Drug use: No   Sexual activity: Not Currently    Partners: Male    Comment: 1st intercourse 21 yo-1 partner

## 2020-10-14 ENCOUNTER — Ambulatory Visit (INDEPENDENT_AMBULATORY_CARE_PROVIDER_SITE_OTHER): Payer: 59 | Admitting: Orthopaedic Surgery

## 2020-10-14 ENCOUNTER — Encounter: Payer: Self-pay | Admitting: Orthopaedic Surgery

## 2020-10-14 ENCOUNTER — Other Ambulatory Visit: Payer: Self-pay

## 2020-10-14 DIAGNOSIS — M84362A Stress fracture, left tibia, initial encounter for fracture: Secondary | ICD-10-CM | POA: Insufficient documentation

## 2020-10-14 NOTE — Progress Notes (Signed)
Office Visit Note   Patient: Emily Lara           Date of Birth: 02/12/1966           MRN: [id number] Visit Date: 10/14/2020              Requested by: Horald Pollen, [place],  [place] [address] PCP: Horald Pollen, MD   Assessment & Plan: Visit Diagnoses:  1. Stress fracture of left tibia, initial encounter     Plan: Patient needs to be out of work for 6 weeks nonweightbearing using a folding walker 4-prong and nonweightbearing.  Her daughter is here with her today and is included in the discussion.  MRI scan was reviewed copy of the report was given.  Recheck 6 weeks with 2 view x-rays left knee on return.  Follow-Up Instructions: No follow-ups on file.   Orders:  No orders of the defined types were placed in this encounter.  No orders of the defined types were placed in this encounter.     Procedures: No procedures performed   Clinical Data: No additional findings.   Subjective: Chief Complaint  Patient presents with   Left Knee - Pain, Follow-up    HPI 55 year old female returns post MRI scan for severe knee pain that she rates as 10 out of 10 primarily medial and slightly below the joint line.  She has been walking with a bent knee hop limp gait.  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  She has had significant pain for 2 months.  No history of falls or injuries.  Bone density test 06/12/2019 showed some osteopenia no osteoporosis.  Review of Systems all systems noncontributory.  She did get an injection at another office for Planter fasciitis which responded well to treatment.   Objective: Vital Signs: BP 126/82   Pulse 96   Ht '4\' 11"'$  (1.499 m)   Wt 156 lb (70.8 kg)   LMP 10/30/2017   BMI 31.51 kg/m   Physical Exam Constitutional:      Appearance: She is well-developed.  HENT:     Head: Normocephalic.     Right Ear: External ear normal.     Left Ear: External ear normal. There is no  impacted cerumen.  Eyes:     Pupils: Pupils are equal, round, and reactive to light.  Neck:     Thyroid: No thyromegaly.     Trachea: No tracheal deviation.  Cardiovascular:     Rate and Rhythm: Normal rate.  Pulmonary:     Effort: Pulmonary effort is normal.  Abdominal:     Palpations: Abdomen is soft.  Musculoskeletal:     Cervical back: No rigidity.  Skin:    General: Skin is warm and dry.  Neurological:     Mental Status: She is alert and oriented to person, place, and time.  Psychiatric:        Behavior: Behavior normal.    Ortho Exam exquisite medial joint line tenderness left knee only no lateral joint line tenderness.  Collateral ligaments are stable.  Specialty Comments:  No specialty comments available.  Imaging: No results found.CLINICAL DATA:  Medial left knee pain for 1 month. No known injury.   EXAM: MRI OF THE LEFT KNEE WITHOUT CONTRAST   TECHNIQUE: Multiplanar, multisequence MR imaging of the knee was performed. No intravenous contrast was administered.   COMPARISON:  None.   FINDINGS: MENISCI   Medial: Radial  tear of the posterior horn of medial meniscus towards the meniscal root with peripheral meniscal extrusion.   Lateral: Intact.   LIGAMENTS   Cruciates: ACL and PCL are intact.   Collaterals: Medial collateral ligament is intact. Lateral collateral ligament complex is intact.   CARTILAGE   Patellofemoral: Partial-thickness cartilage loss of the medial patellar facet.   Medial: Partial-thickness cartilage loss of the medial femorotibial compartment.   Lateral:  No chondral defect.   JOINT: No joint effusion. Normal Hoffa's fat-pad. No plical thickening.   POPLITEAL FOSSA: Popliteus tendon is intact. No Baker's cyst.   EXTENSOR MECHANISM: Intact quadriceps tendon. Intact patellar tendon. Intact lateral patellar retinaculum. Intact medial patellar retinaculum. Intact MPFL.   BONES: No aggressive osseous lesion. Severe bone  marrow edema in the proximal medial tibial metaphysis with a transverse linear low signal area most consistent with a nondisplaced fracture with the fracture cleft extending to the posterior most aspect of the articular surface. Mild soft tissue edema adjacent to the proximal medial tibial metaphysis.   Other: No fluid collection or hematoma. Muscles are normal.   IMPRESSION: 1. Acute, nondisplaced fracture of the proximal medial tibial metaphysis with the fracture cleft extending to the posterior most aspect of the articular surface. 2. Radial tear of the posterior horn of medial meniscus towards the meniscal root with peripheral meniscal extrusion. 3. Partial-thickness cartilage loss of the medial femorotibial compartment.     Electronically Signed   By: Kathreen Devoid M.D.   On: 10/07/2020 07:23   PMFS History: Patient Active Problem List   Diagnosis Date Noted   Stress fracture of left tibia 10/14/2020   Vitamin D insufficiency 07/26/2015   Past Medical History:  Diagnosis Date   Allergy    GERD (gastroesophageal reflux disease)    Pre-diabetes    diet controlled    Vitamin D deficiency     Family History  Problem Relation Age of Onset   Hyperlipidemia Father    Thyroid disease Father    Alcohol abuse Neg Hx    Cancer Neg Hx    COPD Neg Hx    Depression Neg Hx    Diabetes Neg Hx    Drug abuse Neg Hx    Early death Neg Hx    Hearing loss Neg Hx    Heart disease Neg Hx    Hypertension Neg Hx    Kidney disease Neg Hx    Stroke Neg Hx    Colon cancer Neg Hx    Colon polyps Neg Hx    Esophageal cancer Neg Hx    Rectal cancer Neg Hx    Stomach cancer Neg Hx     Past Surgical History:  Procedure Laterality Date   CESAREAN SECTION  2007   Social History   Occupational History   Not on file  Tobacco Use   Smoking status: Never   Smokeless tobacco: Never  Vaping Use   Vaping Use: Never used  Substance and Sexual Activity   Alcohol use: No     Alcohol/week: 0.0 standard drinks   Drug use: No   Sexual activity: Not Currently    Partners: Male    Comment: 1st intercourse 10 yo-1 partner

## 2021-01-01 ENCOUNTER — Other Ambulatory Visit: Payer: Self-pay

## 2021-01-01 ENCOUNTER — Ambulatory Visit (INDEPENDENT_AMBULATORY_CARE_PROVIDER_SITE_OTHER): Payer: 59 | Admitting: Podiatry

## 2021-01-01 DIAGNOSIS — M722 Plantar fascial fibromatosis: Secondary | ICD-10-CM

## 2021-01-01 MED ORDER — PREDNISONE 5 MG PO TABS: ORAL_TABLET | ORAL | 0 refills | Status: AC

## 2021-01-01 MED ORDER — METHYLPREDNISOLONE 4 MG PO TBPK: ORAL_TABLET | ORAL | 0 refills | Status: DC

## 2021-01-01 MED ORDER — MELOXICAM 15 MG PO TABS: 15.0000 mg | ORAL_TABLET | Freq: Every day | ORAL | 3 refills | Status: AC

## 2021-01-05 NOTE — Progress Notes (Signed)
  Subjective:  Patient ID: Emily Lara, female    DOB: 08-02-1965,  MRN: [id number]  Chief Complaint  Patient presents with   Plantar Fasciitis    Follow up left foot    55 y.o. female presents with the above complaint. History confirmed with patient.  Was doing well for couple weeks after the last injection lasted about 2 months.  Started to resume when she has been busy.  Objective:  Physical Exam: warm, good capillary refill, no trophic changes or ulcerative lesions, normal DP and PT pulses, and normal sensory exam. Left Foot: point tenderness over the heel pad  Right Foot: point tenderness over the heel pad   No images are attached to the encounter.  Radiographs: Multiple views x-ray of both feet: no fracture, dislocation, swelling or degenerative changes noted and plantar calcaneal spur Assessment:   1. Bilateral plantar fasciitis      Plan:  Patient was evaluated and treated and all questions answered.  Discussed the etiology and treatment options for plantar fasciitis including stretching, formal physical therapy, supportive shoegears such as a running shoe or sneaker, pre fabricated orthoses, injection therapy, and oral medications. We also discussed the role of surgical treatment of this for patients who do not improve after exhausting non-surgical treatment options.   -I recommend referral to physical therapy -Repeat injection delivered to the plantar fascia of both feet. -Rx for meloxicam. Educated on use, risks and benefits of the medication -Recommended a prednisone taper as well and I sent this to her pharmacy educated on use risks and benefits of the medication  After sterile prep with povidone-iodine solution and alcohol, the bilateral heel was injected with 0.5cc 2% xylocaine plain, 0.5cc 0.5% marcaine plain, 5mg  triamcinolone acetonide, and 2mg  dexamethasone was injected along the medial plantar fascia at the insertion on the plantar calcaneus. The patient  tolerated the procedure well without complication.  Return in about 2 months (around 03/03/2021) for recheck plantar fasciitis.

## 2021-01-26 ENCOUNTER — Encounter: Payer: Self-pay | Admitting: Obstetrics & Gynecology

## 2021-01-26 ENCOUNTER — Other Ambulatory Visit: Payer: Self-pay

## 2021-01-26 ENCOUNTER — Ambulatory Visit (INDEPENDENT_AMBULATORY_CARE_PROVIDER_SITE_OTHER): Payer: 59 | Admitting: Obstetrics & Gynecology

## 2021-01-26 VITALS — BP 138/88 | HR 71 | Ht 60.25 in | Wt 153.0 lb

## 2021-01-26 DIAGNOSIS — N393 Stress incontinence (female) (male): Secondary | ICD-10-CM | POA: Diagnosis not present

## 2021-01-26 DIAGNOSIS — N952 Postmenopausal atrophic vaginitis: Secondary | ICD-10-CM

## 2021-01-26 DIAGNOSIS — Z01419 Encounter for gynecological examination (general) (routine) without abnormal findings: Secondary | ICD-10-CM

## 2021-01-26 DIAGNOSIS — M8589 Other specified disorders of bone density and structure, multiple sites: Secondary | ICD-10-CM | POA: Diagnosis not present

## 2021-01-26 DIAGNOSIS — Z780 Asymptomatic menopausal state: Secondary | ICD-10-CM | POA: Diagnosis not present

## 2021-01-26 MED ORDER — ESTRADIOL 10 MCG VA TABS: 1.0000 | ORAL_TABLET | VAGINAL | 4 refills | Status: DC

## 2021-01-26 NOTE — Progress Notes (Signed)
Emily Lara 01-Dec-1965 [id number]   History:    55 y.o. [id number] Married  RP:  Established patient presenting for annual gyn exam   HPI: Postmenopausal, well on no HRT.  Mild hot flashes but no significant night sweats or mood changes.  Decreased libido noted and dryness with IC.  Pap Neg in 04/2019.  No significant history of abnormal Pap smears.  C/O SUI with coughing and sneezing.  Breasts normal.  Mammogram 04/2020.  Colonoscopy 05/2019.  DEXA 05/2019 Osteopenia with T-Score -2.1 at [facility].  Vit D to increase at 2000 IU/day.  Vit D was 35 in 05/2020.  Taking a Ca++ supplement.  Walking. Health labs with Fam MD.  Past medical history,surgical history, family history and social history were all reviewed and documented in the EPIC chart.  Gynecologic History Patient's last menstrual period was 10/30/2017.  Obstetric History OB History  Gravida Para Term Preterm AB Living  3 2     1 2   SAB IAB Ectopic Multiple Live Births  1            # Outcome Date GA Lbr Len/2nd Weight Sex Delivery Anes PTL Lv  3 SAB           2 Para           1 Para              ROS: A ROS was performed and pertinent positives and negatives are included in the history.  GENERAL: No fevers or chills. HEENT: No change in vision, no earache, sore throat or sinus congestion. NECK: No pain or stiffness. CARDIOVASCULAR: No chest pain or pressure. No palpitations. PULMONARY: No shortness of breath, cough or wheeze. GASTROINTESTINAL: No abdominal pain, nausea, vomiting or diarrhea, melena or bright red blood per rectum. GENITOURINARY: No urinary frequency, urgency, hesitancy or dysuria. MUSCULOSKELETAL: No joint or muscle pain, no back pain, no recent trauma. DERMATOLOGIC: No rash, no itching, no lesions. ENDOCRINE: No polyuria, polydipsia, no heat or cold intolerance. No recent change in weight. HEMATOLOGICAL: No anemia or easy bruising or bleeding. NEUROLOGIC: No headache, seizures, numbness, tingling or weakness.  PSYCHIATRIC: No depression, no loss of interest in normal activity or change in sleep pattern.     Exam:   BP 138/88   Pulse 71   Ht 5' 0.25" (1.53 m)   Wt 153 lb (69.4 kg)   LMP 10/30/2017   SpO2 98%   BMI 29.63 kg/m   Body mass index is 29.63 kg/m.  General appearance : Well developed well nourished female. No acute distress HEENT: Eyes: no retinal hemorrhage or exudates,  Neck supple, trachea midline, no carotid bruits, no thyroidmegaly Lungs: Clear to auscultation, no rhonchi or wheezes, or rib retractions  Heart: Regular rate and rhythm, no murmurs or gallops Breast:Examined in sitting and supine position were symmetrical in appearance, no palpable masses or tenderness,  no skin retraction, no nipple inversion, no nipple discharge, no skin discoloration, no axillary or supraclavicular lymphadenopathy Abdomen: no palpable masses or tenderness, no rebound or guarding Extremities: no edema or skin discoloration or tenderness  Pelvic: Vulva: Normal             Vagina: No gross lesions or discharge  Cervix: No gross lesions or discharge.    Uterus  AV, normal size, shape and consistency, non-tender and mobile  Adnexa  Without masses or tenderness  Anus: Normal   Assessment/Plan:  55 y.o. female for annual exam   1. Well  female exam with routine gynecological exam Postmenopausal, well on no HRT.  Mild hot flashes but no significant night sweats or mood changes.  Decreased libido noted and dryness with IC.  Pap Neg in 04/2019.  No significant history of abnormal Pap smears.  C/O SUI with coughing and sneezing.  Breasts normal.  Mammogram 04/2020.  Colonoscopy 05/2019.  DEXA 05/2019 Osteopenia with T-Score -2.1 at [facility].  Vit D to increase at 2000 IU/day.  Vit D was 35 in 05/2020.  Taking a Ca++ supplement.  Walking. Health labs with Fam MD.  2. Postmenopause Postmenopausal, well on no HRT.  Mild hot flashes but no significant night sweats or mood changes.    3. Postmenopausal  atrophic vaginitis Postmenopausal, well on no HRT except for decreased libido noted and dryness with IC.  Counseling done and decision to start on the Estradiol tab twice a week vaginally.  Usage reviewed and prescription sent to pharmacy.  4. Osteopenia of multiple sites DEXA 05/2019 Osteopenia with T-Score -2.1 at [facility].  Vit D to increase at 2000 IU/day.  Vit D was 35.8 in 05/2020.  Taking a Ca++ supplement, recommend a total of 1.2- 1.5 g/d.  Walking. - DG Bone Density; Future  5. SUI (stress urinary incontinence, female) Kegel exercises instructed.  Refer to Physical therapy.  Avoid overfilling the bladder.  Other orders - RESTASIS 0.05 % ophthalmic emulsion; 1 drop 2 (two) times daily. - Estradiol 10 MCG TABS vaginal tablet; Place 1 tablet (10 mcg total) vaginally 2 (two) times a week.   Princess Bruins MD, 2:07 PM 01/26/2021

## 2021-01-27 ENCOUNTER — Telehealth: Payer: Self-pay

## 2021-01-27 DIAGNOSIS — N393 Stress incontinence (female) (male): Secondary | ICD-10-CM

## 2021-01-27 NOTE — Telephone Encounter (Signed)
-----   Message from Princess Bruins, MD sent at 01/26/2021  2:47 PM EST ----- Regarding: Refer to Physical therapy for pelvic floor SUI.

## 2021-01-27 NOTE — Telephone Encounter (Signed)
Referral placed in system

## 2021-02-19 NOTE — Telephone Encounter (Signed)
Pt was called on 01/28/21. Notes report pt to call back to schedule appt.

## 2021-02-19 NOTE — Telephone Encounter (Signed)
"  We're sorry, your call cannot be completed at this time. Please try the call again later."

## 2021-02-20 NOTE — Telephone Encounter (Signed)
Spoke with Phi per DPR and informed her Iam trying to get in touch her sister re: referral we sent for her in 01/2021 for physical therapy. Just need to know what her plans are for that, whether she plans to move forward with it or not. Phi states she will have her call me back.

## 2021-02-20 NOTE — Telephone Encounter (Signed)
Left msg on Phi Kirby's VM, pt's sister per DPR for her to have Boni call us back at her earliest convenience since I cant get in touch w/ her via mobile number we have on file.

## 2021-03-05 ENCOUNTER — Ambulatory Visit: Payer: Self-pay | Admitting: Podiatry

## 2021-03-24 NOTE — Telephone Encounter (Signed)
Per referral note from 03/16/21, pt was notified via VM to return call.

## 2021-03-30 ENCOUNTER — Other Ambulatory Visit: Payer: Self-pay | Admitting: Obstetrics & Gynecology

## 2021-03-30 ENCOUNTER — Other Ambulatory Visit: Payer: Self-pay | Admitting: Emergency Medicine

## 2021-03-30 DIAGNOSIS — Z1231 Encounter for screening mammogram for malignant neoplasm of breast: Secondary | ICD-10-CM

## 2021-03-31 ENCOUNTER — Ambulatory Visit: Payer: Self-pay | Admitting: Podiatry

## 2021-04-03 NOTE — Telephone Encounter (Signed)
Multiple attempts to reach patient re: referral appt by [place] and PT with no response. Ok to close?

## 2021-04-20 ENCOUNTER — Other Ambulatory Visit: Payer: Self-pay

## 2021-04-20 ENCOUNTER — Encounter: Payer: Self-pay | Admitting: Emergency Medicine

## 2021-04-20 ENCOUNTER — Ambulatory Visit (INDEPENDENT_AMBULATORY_CARE_PROVIDER_SITE_OTHER): Payer: 59 | Admitting: Emergency Medicine

## 2021-04-20 VITALS — BP 124/86 | HR 83 | Temp 98.4°F | Ht 60.0 in | Wt 152.0 lb

## 2021-04-20 DIAGNOSIS — Z130 Encounter for screening for diseases of the blood and blood-forming organs and certain disorders involving the immune mechanism: Secondary | ICD-10-CM

## 2021-04-20 DIAGNOSIS — Z13228 Encounter for screening for other metabolic disorders: Secondary | ICD-10-CM | POA: Diagnosis not present

## 2021-04-20 DIAGNOSIS — Z0000 Encounter for general adult medical examination without abnormal findings: Secondary | ICD-10-CM | POA: Diagnosis not present

## 2021-04-20 DIAGNOSIS — Z1329 Encounter for screening for other suspected endocrine disorder: Secondary | ICD-10-CM | POA: Diagnosis not present

## 2021-04-20 DIAGNOSIS — Z13220 Encounter for screening for lipoid disorders: Secondary | ICD-10-CM | POA: Diagnosis not present

## 2021-04-20 LAB — COMPREHENSIVE METABOLIC PANEL
ALT: 39 U/L — ABNORMAL HIGH (ref 0–35)
AST: 26 U/L (ref 0–37)
Albumin: 4.5 g/dL (ref 3.5–5.2)
Alkaline Phosphatase: 53 U/L (ref 39–117)
BUN: 13 mg/dL (ref 6–23)
CO2: 28 mEq/L (ref 19–32)
Calcium: 9.4 mg/dL (ref 8.4–10.5)
Chloride: 106 mEq/L (ref 96–112)
Creatinine, Ser: 0.5 mg/dL (ref 0.40–1.20)
GFR: 107.14 mL/min (ref >60.00)
Glucose, Bld: 92 mg/dL (ref 70–99)
Potassium: 3.6 mEq/L (ref 3.5–5.1)
Sodium: 142 mEq/L (ref 135–145)
Total Bilirubin: 0.9 mg/dL (ref 0.2–1.2)
Total Protein: 7.4 g/dL (ref 6.0–8.3)

## 2021-04-20 LAB — LIPID PANEL
Cholesterol: 153 mg/dL (ref 0–200)
HDL: 54.4 mg/dL (ref >39.00)
LDL Cholesterol: 65 mg/dL (ref 0–99)
NonHDL: 98.63
Total CHOL/HDL Ratio: 3
Triglycerides: 166 mg/dL — ABNORMAL HIGH (ref 0.0–149.0)
VLDL: 33.2 mg/dL (ref 0.0–40.0)

## 2021-04-20 LAB — CBC WITH DIFFERENTIAL/PLATELET
Basophils Absolute: 0.1 10*3/uL (ref 0.0–0.1)
Basophils Relative: 0.8 % (ref 0.0–3.0)
Eosinophils Absolute: 0.4 10*3/uL (ref 0.0–0.7)
Eosinophils Relative: 6.2 % — ABNORMAL HIGH (ref 0.0–5.0)
HCT: 41.6 % (ref 36.0–46.0)
Hemoglobin: 14 g/dL (ref 12.0–15.0)
Lymphocytes Relative: 40 % (ref 12.0–46.0)
Lymphs Abs: 2.5 10*3/uL (ref 0.7–4.0)
MCHC: 33.6 g/dL (ref 30.0–36.0)
MCV: 93.1 fl (ref 78.0–100.0)
Monocytes Absolute: 0.4 10*3/uL (ref 0.1–1.0)
Monocytes Relative: 7 % (ref 3.0–12.0)
Neutro Abs: 2.9 10*3/uL (ref 1.4–7.7)
Neutrophils Relative %: 46 % (ref 43.0–77.0)
Platelets: 256 10*3/uL (ref 150.0–400.0)
RBC: 4.47 Mil/uL (ref 3.87–5.11)
RDW: 14.4 % (ref 11.5–15.5)
WBC: 6.4 10*3/uL (ref 4.0–10.5)

## 2021-04-20 LAB — TSH: TSH: 1.2 u[IU]/mL (ref 0.35–5.50)

## 2021-04-20 LAB — HEMOGLOBIN A1C: Hgb A1c MFr Bld: 6 % (ref 4.6–6.5)

## 2021-04-20 NOTE — Patient Instructions (Signed)

## 2021-04-20 NOTE — Progress Notes (Signed)
Emily Lara 56 y.o.   Chief Complaint  Patient presents with   Annual Exam    HISTORY OF PRESENT ILLNESS: This is a 56 y.o. female here for her annual exam. Complaining of not able to lose weight despite eating well. Does not consume a whole lot of carbohydrates or sugar. Could exercise more but knees start to hurt when she uses the treadmill. Does a reasonable amount of walking daily at work. No other complaints or medical concerns today. Up-to-date with mammograms and colonoscopies. Sees gynecologist on a regular basis.  HPI   Prior to Admission medications   Medication Sig Start Date End Date Taking? Authorizing Provider  Ascorbic Acid (VITAMIN C) 1000 MG tablet Take 1,000 mg by mouth daily.    [provider]  Estradiol 10 MCG TABS vaginal tablet Place 1 tablet (10 mcg total) vaginally 2 (two) times a week. 01/26/21   Genia Del, MD  meloxicam (MOBIC) 15 MG tablet Take 1 tablet (15 mg total) by mouth daily. 01/08/21   Edwin Cap, DPM  Multiple Vitamin (MULTIVITAMIN) tablet Take 1 tablet by mouth daily.    [provider]  RESTASIS 0.05 % ophthalmic emulsion 1 drop 2 (two) times daily. 10/21/20   [provider]  VITAMIN D PO Take 2,000 Units by mouth daily.    [provider]    Not on File  Patient Active Problem List   Diagnosis Date Noted   Stress fracture of left tibia 10/14/2020   Vitamin D insufficiency 07/26/2015    Past Medical History:  Diagnosis Date   Allergy    GERD (gastroesophageal reflux disease)    Pre-diabetes    diet controlled    Vitamin D deficiency     Past Surgical History:  Procedure Laterality Date   CESAREAN SECTION  2007    Social History   Socioeconomic History   Marital status: Married    Spouse name: Not on file   Number of children: Not on file   Years of education: Not on file   Highest education level: Not on file  Occupational History   Not on file  Tobacco Use    Smoking status: Never   Smokeless tobacco: Never  Vaping Use   Vaping Use: Never used  Substance and Sexual Activity   Alcohol use: No    Alcohol/week: 0.0 standard drinks   Drug use: No   Sexual activity: Yes    Partners: Male    Birth control/protection: Post-menopausal    Comment: 1st intercourse 36 yo-1 partner  Other Topics Concern   Not on file  Social History Narrative   Not on file   Social Determinants of Health   Financial Resource Strain: Not on file  Food Insecurity: Not on file  Transportation Needs: Not on file  Physical Activity: Not on file  Stress: Not on file  Social Connections: Not on file  Intimate Partner Violence: Not on file    Family History  Problem Relation Age of Onset   Hyperlipidemia Father    Thyroid disease Father    Alcohol abuse Neg Hx    Cancer Neg Hx    COPD Neg Hx    Depression Neg Hx    Diabetes Neg Hx    Drug abuse Neg Hx    Early death Neg Hx    Hearing loss Neg Hx    Heart disease Neg Hx    Hypertension Neg Hx    Kidney disease Neg Hx  Stroke Neg Hx    Colon cancer Neg Hx    Colon polyps Neg Hx    Esophageal cancer Neg Hx    Rectal cancer Neg Hx    Stomach cancer Neg Hx      Review of Systems  Constitutional: Negative.  Negative for chills and fever.  HENT: Negative.  Negative for congestion and sore throat.   Respiratory: Negative.  Negative for cough and shortness of breath.   Cardiovascular: Negative.  Negative for chest pain and palpitations.  Gastrointestinal: Negative.  Negative for abdominal pain, blood in stool, diarrhea, nausea and vomiting.  Musculoskeletal: Negative.  Negative for myalgias.  Skin: Negative.  Negative for rash.  Neurological: Negative.  Negative for dizziness and headaches.  All other systems reviewed and are negative.  Today's Vitals   04/20/21 1356  BP: 124/86  Pulse: 83  Temp: 98.4 F (36.9 C)  TempSrc: Oral  SpO2: 96%  Weight: 152 lb (68.9 kg)  Height: 5' (1.524 m)    Body mass index is 29.69 kg/m.  Physical Exam Vitals reviewed.  Constitutional:      Appearance: Normal appearance.  HENT:     Head: Normocephalic.     Right Ear: Tympanic membrane, ear canal and external ear normal.     Left Ear: Tympanic membrane, ear canal and external ear normal.     Mouth/Throat:     Mouth: Mucous membranes are moist.     Pharynx: Oropharynx is clear.  Eyes:     Extraocular Movements: Extraocular movements intact.     Conjunctiva/sclera: Conjunctivae normal.     Pupils: Pupils are equal, round, and reactive to light.  Cardiovascular:     Rate and Rhythm: Normal rate and regular rhythm.     Pulses: Normal pulses.     Heart sounds: Normal heart sounds.  Pulmonary:     Effort: Pulmonary effort is normal.     Breath sounds: Normal breath sounds.  Abdominal:     General: Bowel sounds are normal. There is no distension.     Palpations: Abdomen is soft.     Tenderness: There is no abdominal tenderness.  Musculoskeletal:        General: Normal range of motion.     Cervical back: Neck supple. No tenderness.     Right lower leg: No edema.     Left lower leg: No edema.  Lymphadenopathy:     Cervical: No cervical adenopathy.  Skin:    General: Skin is warm and dry.     Capillary Refill: Capillary refill takes less than 2 seconds.  Neurological:     General: No focal deficit present.     Mental Status: She is alert and oriented to person, place, and time.  Psychiatric:        Mood and Affect: Mood normal.        Behavior: Behavior normal.     ASSESSMENT & PLAN: Problem List Items Addressed This Visit   None Visit Diagnoses     Routine general medical examination at a health care facility    -  Primary   Screening for deficiency anemia       Relevant Orders   CBC with Differential   Screening for lipoid disorders       Relevant Orders   Lipid panel   Screening for endocrine, metabolic and immunity disorder       Relevant Orders   Comprehensive  metabolic panel   Hemoglobin A1c   TSH  Modifiable risk factors discussed with patient. Anticipatory guidance according to age provided. The following topics were also discussed: Social Determinants of Health Smoking.  Non-smoker Diet and nutrition Benefits of exercise Cancer screening and review of most recent mammogram and colonoscopy reports Vaccinations recommendations Cardiovascular risk assessment Mental health including depression and anxiety Fall and accident prevention   Patient Instructions  Health Maintenance, Female Adopting a healthy lifestyle and getting preventive care are important in promoting health and wellness. Ask your health care provider about: The right schedule for you to have regular tests and exams. Things you can do on your own to prevent diseases and keep yourself healthy. What should I know about diet, weight, and exercise? Eat a healthy diet  Eat a diet that includes plenty of vegetables, fruits, low-fat dairy products, and lean protein. Do not eat a lot of foods that are high in solid fats, added sugars, or sodium. Maintain a healthy weight Body mass index (BMI) is used to identify weight problems. It estimates body fat based on height and weight. Your health care provider can help determine your BMI and help you achieve or maintain a healthy weight. Get regular exercise Get regular exercise. This is one of the most important things you can do for your health. Most adults should: Exercise for at least 150 minutes each week. The exercise should increase your heart rate and make you sweat (moderate-intensity exercise). Do strengthening exercises at least twice a week. This is in addition to the moderate-intensity exercise. Spend less time sitting. Even light physical activity can be beneficial. Watch cholesterol and blood lipids Have your blood tested for lipids and cholesterol at 54 years of age, then have this test every 5 years. Have your  cholesterol levels checked more often if: Your lipid or cholesterol levels are high. You are older than 54 years of age. You are at high risk for heart disease. What should I know about cancer screening? Depending on your health history and family history, you may need to have cancer screening at various ages. This may include screening for: Breast cancer. Cervical cancer. Colorectal cancer. Skin cancer. Lung cancer. What should I know about heart disease, diabetes, and high blood pressure? Blood pressure and heart disease High blood pressure causes heart disease and increases the risk of stroke. This is more likely to develop in people who have high blood pressure readings or are overweight. Have your blood pressure checked: Every 3-5 years if you are 37-61 years of age. Every year if you are 75 years old or older. Diabetes Have regular diabetes screenings. This checks your fasting blood sugar level. Have the screening done: Once every three years after age 50 if you are at a normal weight and have a low risk for diabetes. More often and at a younger age if you are overweight or have a high risk for diabetes. What should I know about preventing infection? Hepatitis B If you have a higher risk for hepatitis B, you should be screened for this virus. Talk with your health care provider to find out if you are at risk for hepatitis B infection. Hepatitis C Testing is recommended for: Everyone born from [birth year] through 1963. Anyone with known risk factors for hepatitis C. Sexually transmitted infections (STIs) Get screened for STIs, including gonorrhea and chlamydia, if: You are sexually active and are younger than 53 years of age. You are older than 54 years of age and your health care provider tells you that you are at risk  for this type of infection. Your sexual activity has changed since you were last screened, and you are at increased risk for chlamydia or gonorrhea. Ask your health care  provider if you are at risk. Ask your health care provider about whether you are at high risk for HIV. Your health care provider may recommend a prescription medicine to help prevent HIV infection. If you choose to take medicine to prevent HIV, you should first get tested for HIV. You should then be tested every 3 months for as long as you are taking the medicine. Pregnancy If you are about to stop having your period (premenopausal) and you may become pregnant, seek counseling before you get pregnant. Take 400 to 800 micrograms (mcg) of folic acid every day if you become pregnant. Ask for birth control (contraception) if you want to prevent pregnancy. Osteoporosis and menopause Osteoporosis is a disease in which the bones lose minerals and strength with aging. This can result in bone fractures. If you are 45 years old or older, or if you are at risk for osteoporosis and fractures, ask your health care provider if you should: Be screened for bone loss. Take a calcium or vitamin D supplement to lower your risk of fractures. Be given hormone replacement therapy (HRT) to treat symptoms of menopause. Follow these instructions at home: Alcohol use Do not drink alcohol if: Your health care provider tells you not to drink. You are pregnant, may be pregnant, or are planning to become pregnant. If you drink alcohol: Limit how much you have to: 0-1 drink a day. Know how much alcohol is in your drink. In the [place] one drink equals one 12 oz bottle of beer (355 mL), one 5 oz glass of wine (148 mL), or one 1 oz glass of hard liquor (44 mL). Lifestyle Do not use any products that contain nicotine or tobacco. These products include cigarettes, chewing tobacco, and vaping devices, such as e-cigarettes. If you need help quitting, ask your health care provider. Do not use street drugs. Do not share needles. Ask your health care provider for help if you need support or information about quitting drugs. General  instructions Schedule regular health, dental, and eye exams. Stay current with your vaccines. Tell your health care provider if: You often feel depressed. You have ever been abused or do not feel safe at home. Summary Adopting a healthy lifestyle and getting preventive care are important in promoting health and wellness. Follow your health care provider's instructions about healthy diet, exercising, and getting tested or screened for diseases. Follow your health care provider's instructions on monitoring your cholesterol and blood pressure. This information is not intended to replace advice given to you by your health care provider. Make sure you discuss any questions you have with your health care provider. Document Revised: 06/23/2020 Document Reviewed: 06/23/2020 Elsevier Patient Education  2022 Elsevier Inc.      Edwina Barth, MD [facility]

## 2021-04-21 ENCOUNTER — Ambulatory Visit: Payer: Self-pay

## 2021-04-21 LAB — URINALYSIS, ROUTINE W REFLEX MICROSCOPIC
Bilirubin Urine: NEGATIVE
Ketones, ur: NEGATIVE
Nitrite: NEGATIVE
Specific Gravity, Urine: 1.025 (ref 1.000–1.030)
Total Protein, Urine: NEGATIVE
Urine Glucose: NEGATIVE
Urobilinogen, UA: 0.2 (ref 0.0–1.0)
pH: 6 (ref 5.0–8.0)

## 2021-04-23 ENCOUNTER — Other Ambulatory Visit: Payer: Self-pay | Admitting: Emergency Medicine

## 2021-04-23 DIAGNOSIS — R8290 Unspecified abnormal findings in urine: Secondary | ICD-10-CM

## 2021-04-23 NOTE — Progress Notes (Signed)
Urine culture ordered. Thanks

## 2021-05-14 ENCOUNTER — Ambulatory Visit (INDEPENDENT_AMBULATORY_CARE_PROVIDER_SITE_OTHER): Payer: 59

## 2021-05-14 DIAGNOSIS — Z1231 Encounter for screening mammogram for malignant neoplasm of breast: Secondary | ICD-10-CM | POA: Diagnosis not present

## 2021-06-16 DIAGNOSIS — Z0289 Encounter for other administrative examinations: Secondary | ICD-10-CM

## 2021-08-06 ENCOUNTER — Encounter: Payer: Self-pay | Admitting: Surgery

## 2021-08-06 ENCOUNTER — Ambulatory Visit: Payer: 59 | Admitting: Surgery

## 2021-08-06 ENCOUNTER — Ambulatory Visit: Payer: Self-pay

## 2021-08-06 VITALS — BP 115/80 | HR 86 | Ht 60.0 in | Wt 152.0 lb

## 2021-08-06 DIAGNOSIS — M25561 Pain in right knee: Secondary | ICD-10-CM | POA: Diagnosis not present

## 2021-08-10 ENCOUNTER — Encounter (HOSPITAL_COMMUNITY): Payer: Self-pay | Admitting: Radiology

## 2021-08-10 ENCOUNTER — Ambulatory Visit (HOSPITAL_COMMUNITY)
Admission: RE | Admit: 2021-08-10 | Discharge: 2021-08-10 | Disposition: A | Discharge status: Home / Self Care | Payer: 59 | Source: Ambulatory Visit | Attending: Surgery | Admitting: Surgery

## 2021-08-10 DIAGNOSIS — M25561 Pain in right knee: Secondary | ICD-10-CM | POA: Insufficient documentation

## 2021-08-12 ENCOUNTER — Ambulatory Visit (INDEPENDENT_AMBULATORY_CARE_PROVIDER_SITE_OTHER): Payer: 59 | Admitting: Emergency Medicine

## 2021-08-12 ENCOUNTER — Encounter: Payer: Self-pay | Admitting: Emergency Medicine

## 2021-08-12 VITALS — BP 126/84 | HR 86 | Temp 98.6°F | Ht 60.0 in | Wt 154.0 lb

## 2021-08-12 DIAGNOSIS — R1013 Epigastric pain: Secondary | ICD-10-CM | POA: Diagnosis not present

## 2021-08-12 MED ORDER — PANTOPRAZOLE SODIUM 40 MG PO TBEC
40.0000 mg | DELAYED_RELEASE_TABLET | Freq: Every day | ORAL | 3 refills | Status: DC
Start: 2021-08-12 — End: 2021-11-12

## 2021-08-12 NOTE — Patient Instructions (Signed)
Enfermedad de reflujo gastroesofgico en los adultos Gastroesophageal Reflux Disease, Adult  El reflujo gastroesofgico (RGE) ocurre cuando el cido del estmago sube por el tubo que conecta la boca con el estmago (esfago). Normalmente, la comida baja por el esfago y se mantiene en el estmago, donde se la digiere. Cuando una persona tiene RGE, los alimentos y el cido estomacal suelen volver al esfago. Usted puede tener una enfermedad llamada enfermedad de reflujo gastroesofgico (ERGE) si el reflujo: Sucede a menudo. Causa sntomas frecuentes o muy intensos. Causa problemas tales como dao en el esfago. Cuando esto ocurre, el esfago duele y se hincha. Con el tiempo, la ERGE puede ocasionar pequeos agujeros (lceras) en el revestimiento del esfago. Cules son las causas? Esta afeccin se debe a un problema en el msculo que se encuentra entre el esfago y el estmago. Cuando este msculo est dbil o no es normal, no se cierra correctamente para impedir que los alimentos y el cido regresen del estmago. El msculo puede debilitarse debido a lo siguiente: El consumo de tabaco. Embarazo. Tener cierto tipo de hernia (hernia de hiato). Consumo de alcohol. Ciertos alimentos y bebidas, como caf, chocolate, cebollas y menta. Qu incrementa el riesgo? Tener sobrepeso. Tener una enfermedad que afecta el tejido conjuntivo. Tomar antiinflamatorios no esteroideos (AINE), como el ibuprofeno. Cules son los signos o sntomas? Acidez estomacal. Dificultad o dolor al tragar. Sensacin de tener un bulto en la garganta. Sabor amargo en la boca. Mal aliento. Tener una gran cantidad de saliva. Estmago inflamado o con malestar. Eructos. Dolor en el pecho. El dolor de pecho puede deberse a distintas afecciones. Asegrese de consultar a su mdico si tiene dolor en el pecho. Dificultad para respirar o sibilancias. Una tos a largo plazo o tos nocturna. Desgaste de la superficie de los dientes  (esmalte dental). Prdida de peso. Cmo se trata? Realizar cambios en la dieta. Tomar medicamentos. Someterse a una ciruga. El tratamiento depender de la gravedad de los sntomas. Siga estas instrucciones en su casa: Comida y bebida  Siga una dieta como se lo haya indicado el mdico. Es posible que deba evitar alimentos y bebidas, por ejemplo: Caf y t negro, con o sin cafena. Bebidas que contengan alcohol. Bebidas energticas y deportivas. Bebidas gaseosas (carbonatadas) y refrescos. Chocolate y cacao. Menta y esencia de menta. Ajo y cebolla. Rbano picante. Alimentos cidos y condimentados. Estos incluyen todos los tipos de pimientos, chile en polvo, curry en polvo, vinagre, salsas picantes y salsa barbacoa. Ctricos y sus jugos, por ejemplo, naranjas, limones y limas. Alimentos que contengan tomate. Estos incluyen salsa roja, chile, salsa picante y pizza con salsa de tomate. Alimentos fritos y grasos. Estos incluyen donas, papas fritas, papitas fritas de bolsa y aderezos con alto contenido de grasa. Carnes con alto contenido de grasa. Estas incluye los perros calientes, chuletas o costillas, embutidos, jamn y tocino. Productos lcteos ricos en grasas, como leche entera, manteca y queso crema. Consuma pequeas cantidades de comida con ms frecuencia. Evite consumir porciones abundantes. Evite beber grandes cantidades de lquidos con las comidas. Evite comer 2 o 3 horas antes de acostarse. Evite recostarse inmediatamente despus de comer. No haga ejercicios enseguida despus de comer. Estilo de vida  No fume ni consuma ningn producto que contenga nicotina o tabaco. Si necesita ayuda para dejar de consumir estos productos, consulte al mdico. Intente reducir el nivel de estrs. Si necesita ayuda para hacer esto, consulte al mdico. Si tiene sobrepeso, baje una cantidad de peso saludable para usted. Consulte a   su mdico para bajar de peso de manera segura. Instrucciones  generales Est atento a cualquier cambio en los sntomas. Tome los medicamentos de venta libre y los recetados solamente como se lo haya indicado el mdico. No tome aspirina, ibuprofeno ni otros antiinflamatorios no esteroideos (AINE) a menos que el mdico lo autorice. Use ropa holgada. No use nada apretado alrededor de la cintura. Levante (eleve) la cabecera de la cama aproximadamente 6 pulgadas (15 cm). Para hacerlo, es posible que tenga que utilizar una cua. Evite inclinarse si al hacerlo empeoran los sntomas. Cumpla con todas las visitas de seguimiento. Comunquese con un mdico si: Aparecen nuevos sntomas. Adelgaza y no sabe por qu. Tiene problemas para tragar o le duele cuando traga. Tiene sibilancias o tos persistente. Tiene la voz ronca. Los sntomas no mejoran con el tratamiento. Solicite ayuda de inmediato si: Siente dolor repentino en los brazos, el cuello, la mandbula, los dientes o la espalda. De repente se siente transpirado, mareado o aturdido. Siente falta de aire o dolor en el pecho. Vomita y el vmito es de color verde, amarillo o negro, o tiene un aspecto similar a la sangre o a los posos de caf. Se desmaya. Las heces (deposiciones) son rojas, sanguinolentas o negras. No puede tragar, beber o comer. Estos sntomas pueden representar un problema grave que constituye una emergencia. No espere a ver si los sntomas desaparecen. Solicite atencin mdica de inmediato. Comunquese con el servicio de emergencias de su localidad (911 en los [place]). No conduzca por sus propios medios hasta el hospital. Resumen Si una persona tiene enfermedad de reflujo gastroesofgico (ERGE), los alimentos y el cido estomacal suben al esfago y causan sntomas o problemas tales como dao en el esfago. El tratamiento depender de la gravedad de los sntomas. Siga una dieta como se lo haya indicado el mdico. Use todos los medicamentos solamente como se lo haya indicado el  mdico. Esta informacin no tiene como fin reemplazar el consejo del mdico. Asegrese de hacerle al mdico cualquier pregunta que tenga. Document Revised: 09/14/2019 Document Reviewed: 09/14/2019 Elsevier Patient Education  2023 Elsevier Inc.  

## 2021-08-12 NOTE — Assessment & Plan Note (Signed)
Clinically stable.  No red flag signs or symptoms. Responded well to PPI. Start pantoprazole 40 mg daily. May need GI referral in the future for upper endoscopy depending on response to medication. Follow-up in 3 months

## 2021-08-12 NOTE — Progress Notes (Signed)
Emily Lara 56 y.o.   Chief Complaint  Patient presents with   Abdominal Pain    Concerns about chest pain ( around the diaphragm area)    HISTORY OF PRESENT ILLNESS: This is a 56 y.o. female complaining of epigastric burning pain that started a couple weeks ago.  Started taking PPI that hospital is taking and is much better today. Denies nausea or vomiting.  Able to eat and drink.  Denies fever or chills.  Denies melena or rectal bleeding. No other complaints or medical concerns today.  Abdominal Pain Pertinent negatives include no dysuria, fever, headaches, hematuria, melena, nausea or vomiting.     Prior to Admission medications   Medication Sig Start Date End Date Taking? Authorizing Provider  Ascorbic Acid (VITAMIN C) 1000 MG tablet Take 1,000 mg by mouth daily.   Yes [provider]  meloxicam (MOBIC) 15 MG tablet Take 1 tablet (15 mg total) by mouth daily. 01/08/21  Yes McDonald, Stephan Minister, DPM  Multiple Vitamin (MULTIVITAMIN) tablet Take 1 tablet by mouth daily.   Yes [provider]  RESTASIS 0.05 % ophthalmic emulsion 1 drop 2 (two) times daily. 10/21/20  Yes [provider]  VITAMIN D PO Take 2,000 Units by mouth daily.   Yes [provider]    No Known Allergies  Patient Active Problem List   Diagnosis Date Noted   Stress fracture of left tibia 10/14/2020   Vitamin D insufficiency 07/26/2015    Past Medical History:  Diagnosis Date   Allergy    GERD (gastroesophageal reflux disease)    Pre-diabetes    diet controlled    Vitamin D deficiency     Past Surgical History:  Procedure Laterality Date   CESAREAN SECTION  2007    Social History   Socioeconomic History   Marital status: Married    Spouse name: Not on file   Number of children: Not on file   Years of education: Not on file   Highest education level: Not on file  Occupational History   Not on file  Tobacco Use   Smoking status: Never   Smokeless  tobacco: Never  Vaping Use   Vaping Use: Never used  Substance and Sexual Activity   Alcohol use: No    Alcohol/week: 0.0 standard drinks of alcohol   Drug use: No   Sexual activity: Yes    Partners: Male    Birth control/protection: Post-menopausal    Comment: 1st intercourse 53 yo-1 partner  Other Topics Concern   Not on file  Social History Narrative   Not on file   Social Determinants of Health   Financial Resource Strain: Not on file  Food Insecurity: Not on file  Transportation Needs: Not on file  Physical Activity: Not on file  Stress: Not on file  Social Connections: Not on file  Intimate Partner Violence: Not on file    Family History  Problem Relation Age of Onset   Hyperlipidemia Father    Thyroid disease Father    Alcohol abuse Neg Hx    Cancer Neg Hx    COPD Neg Hx    Depression Neg Hx    Diabetes Neg Hx    Drug abuse Neg Hx    Early death Neg Hx    Hearing loss Neg Hx    Heart disease Neg Hx    Hypertension Neg Hx    Kidney disease Neg Hx    Stroke Neg Hx    Colon cancer  Neg Hx    Colon polyps Neg Hx    Esophageal cancer Neg Hx    Rectal cancer Neg Hx    Stomach cancer Neg Hx      Review of Systems  Constitutional: Negative.  Negative for chills and fever.  HENT: Negative.  Negative for congestion and sore throat.   Respiratory: Negative.  Negative for cough and shortness of breath.   Cardiovascular: Negative.  Negative for chest pain and palpitations.  Gastrointestinal:  Positive for abdominal pain and heartburn. Negative for blood in stool, melena, nausea and vomiting.  Genitourinary: Negative.  Negative for dysuria and hematuria.  Skin: Negative.  Negative for rash.  Neurological: Negative.  Negative for dizziness and headaches.  All other systems reviewed and are negative.   Today's Vitals   08/12/21 1501  BP: 126/84  Pulse: 86  Temp: 98.6 F (37 C)  TempSrc: Oral  Weight: 154 lb (69.9 kg)  Height: 5' (1.524 m)   Body mass  index is 30.08 kg/m.  Physical Exam Vitals reviewed.  Constitutional:      Appearance: She is well-developed.  HENT:     Head: Normocephalic.  Eyes:     Extraocular Movements: Extraocular movements intact.     Conjunctiva/sclera: Conjunctivae normal.     Pupils: Pupils are equal, round, and reactive to light.  Cardiovascular:     Rate and Rhythm: Normal rate and regular rhythm.     Pulses: Normal pulses.     Heart sounds: Normal heart sounds.  Pulmonary:     Effort: Pulmonary effort is normal.     Breath sounds: Normal breath sounds.  Abdominal:     General: There is no distension.     Palpations: Abdomen is soft. There is no mass.     Tenderness: There is no abdominal tenderness.  Musculoskeletal:     Cervical back: No tenderness.  Lymphadenopathy:     Cervical: No cervical adenopathy.  Skin:    General: Skin is warm and dry.     Capillary Refill: Capillary refill takes less than 2 seconds.  Neurological:     General: No focal deficit present.     Mental Status: She is alert and oriented to person, place, and time.  Psychiatric:        Mood and Affect: Mood normal.        Behavior: Behavior normal.     ASSESSMENT & PLAN: A total of 47 minutes was spent with the patient and counseling/coordination of care regarding preparing for this visit, review of most recent office visit notes, review of most recent blood work results, differential diagnosis of dyspepsia and treatment, education on nutrition, review of all medications, prognosis, documentation, need for follow-up.  Problem List Items Addressed This Visit       Other   Dyspepsia - Primary    Clinically stable.  No red flag signs or symptoms. Responded well to PPI. Start pantoprazole 40 mg daily. May need GI referral in the future for upper endoscopy depending on response to medication. Follow-up in 3 months      Relevant Medications   pantoprazole (PROTONIX) 40 MG tablet   Patient Instructions  Enfermedad  de reflujo gastroesofgico en los adultos Gastroesophageal Reflux Disease, Adult  El reflujo gastroesofgico (RGE) ocurre cuando el cido del estmago sube por el tubo que conecta la boca con el estmago (esfago). Normalmente, la comida baja por el esfago y se mantiene en el estmago, donde se la digiere. Cuando una persona tiene RGE,  los alimentos y el cido estomacal suelen volver al esfago. Usted puede tener una enfermedad llamada enfermedad de reflujo gastroesofgico (ERGE) si el reflujo: Sucede a menudo. Causa sntomas frecuentes o muy intensos. Causa problemas tales como dao en el esfago. Cuando esto ocurre, el esfago duele y se hincha. Con el tiempo, la ERGE puede ocasionar pequeos agujeros (lceras) en el revestimiento del esfago. Cules son las causas? Esta afeccin se debe a un problema en el msculo que se encuentra entre el esfago y [place]. Cuando este msculo est dbil o no es normal, no se cierra correctamente para impedir que los alimentos y el cido regresen del Paramedic. El msculo puede debilitarse debido a lo siguiente: El consumo de [employer]. [place]. Tener cierto tipo de hernia (hernia de hiato). Consumo de alcohol. Ciertos alimentos y bebidas, como caf, chocolate, cebollas y [place]. Qu incrementa el riesgo? Tener sobrepeso. Tener una enfermedad que afecta el tejido conjuntivo. Tomar antiinflamatorios no esteroideos (AINE), como el ibuprofeno. Cules son los signos o sntomas? Acidez estomacal. Dificultad o dolor al tragar. Sensacin de Best boy un bulto en la garganta. Sabor amargo en la boca. Mal aliento. Tener una gran cantidad de saliva. Estmago inflamado o con Tree surgeon. Eructos. Dolor en el pecho. El dolor de pecho puede deberse a distintas afecciones. Asegrese de Teacher, adult education a su mdico si tiene Tourist information centre manager. Dificultad para respirar o sibilancias. Ardelia Mems tos a largo plazo o tos nocturna. Desgaste de la superficie de los dientes (esmalte  dental). Prdida de peso. Cmo se trata? Realizar cambios en la dieta. Tomar medicamentos. Someterse a [place]. El tratamiento depender de la gravedad de los sntomas. Siga estas instrucciones en su casa: Comida y bebida  Siga una dieta como se lo haya indicado el mdico. Es posible que deba evitar alimentos y bebidas, por ejemplo: Caf y t negro, con o sin cafena. Bebidas que contengan alcohol. Bebidas energticas y deportivas. Bebidas gaseosas (carbonatadas) y refrescos. Chocolate y cacao. Menta y [place]. Ajo y cebolla. Rbano picante. Alimentos cidos y condimentados. Estos incluyen todos los tipos de pimientos, [place] en polvo, curry en polvo, vinagre, salsas picantes y [employer]. Ctricos y sus jugos, por ejemplo, naranjas, limones y limas. Alimentos que [employer]. Estos incluyen salsa roja, [place], salsa picante y pizza con salsa de [place]. Alimentos fritos y Radio broadcast assistant. Estos incluyen donas, papas fritas, papitas fritas de bolsa y aderezos con alto contenido de [place]. Carnes con alto contenido de [place]. Estas incluye los perros calientes, chuletas o costillas, embutidos, jamn y tocino. Productos lcteos ricos en grasas, como leche [place], [place] y [place] crema. Consuma pequeas cantidades de comida con ms frecuencia. Evite consumir porciones abundantes. Evite beber grandes cantidades de lquidos con las comidas. Evite comer 2 o 3 horas antes de acostarse. Evite recostarse inmediatamente despus de comer. No haga ejercicios enseguida despus de comer. Estilo de vida  No fume ni consuma ningn producto que contenga nicotina o tabaco. Si necesita ayuda para dejar de consumir estos productos, consulte al mdico. Intente reducir el nivel de estrs. Si necesita ayuda para hacer esto, consulte al mdico. Si tiene sobrepeso, baje una cantidad de peso saludable para usted. Consulte a su mdico para bajar de peso de [employer]. Instrucciones  generales Est atento a cualquier cambio en los sntomas. Tome los medicamentos de venta libre y los recetados solamente como se lo haya indicado el mdico. No tome aspirina, ibuprofeno ni otros antiinflamatorios no esteroideos (AINE) a menos que el mdico lo autorice. Use ropa  holgada. No use nada apretado alrededor de la cintura. [place] (eleve) la cabecera de la cama aproximadamente 6 pulgadas (15 cm). Para hacerlo, es posible que tenga que utilizar una cua. Evite inclinarse si al hacerlo empeoran los sntomas. Cumpla con todas las visitas de seguimiento. Comunquese con un mdico si: Aparecen nuevos sntomas. Adelgaza y no sabe por qu. Tiene problemas para tragar o le duele cuando traga. Tiene sibilancias o tos persistente. Tiene la voz ronca. Los sntomas no mejoran con Dispensing optician. Solicite ayuda de inmediato si: Tree surgeon repentino [employer], el cuello, la [place], los dientes o la espalda. De repente se siente transpirado, mareado o aturdido. Siente falta de aire o Tourist information centre manager. Vomita y el vmito es de color verde, amarillo o negro, o tiene un aspecto similar a la sangre o a los posos de caf. Se desmaya. Las heces (deposiciones) son rojas, sanguinolentas o negras. No puede tragar, beber o comer. Estos sntomas pueden representar un problema grave que constituye Engineer, maintenance (IT). No espere a ver si los sntomas desaparecen. Solicite atencin mdica de inmediato. Comunquese con el servicio de emergencias de su localidad (911 en los [place]). No conduzca por sus propios medios [employer]. Resumen Si una persona tiene enfermedad de reflujo gastroesofgico (ERGE), los alimentos y el cido estomacal suben al esfago y causan sntomas o problemas tales como dao en el esfago. El tratamiento depender de la gravedad de los sntomas. Siga una dieta como se lo haya indicado el mdico. Use todos los medicamentos solamente como se lo haya indicado el  mdico. Esta informacin no tiene Marine scientist el consejo del mdico. Asegrese de hacerle al mdico cualquier pregunta que tenga. Document Revised: 09/14/2019 Document Reviewed: 09/14/2019 Elsevier Patient Education  [place], MD [facility]

## 2021-08-25 ENCOUNTER — Ambulatory Visit: Payer: 59 | Admitting: Orthopaedic Surgery

## 2021-08-25 ENCOUNTER — Encounter: Payer: Self-pay | Admitting: Orthopaedic Surgery

## 2021-08-25 VITALS — BP 135/92 | HR 82 | Ht 60.0 in | Wt 154.0 lb

## 2021-08-25 DIAGNOSIS — M1711 Unilateral primary osteoarthritis, right knee: Secondary | ICD-10-CM

## 2021-08-25 NOTE — Progress Notes (Signed)
Office Visit Note   Patient: Emily Lara           Date of Birth: 10-30-1965           MRN: [id number] Visit Date: 08/25/2021              Requested by: Horald Pollen, [place],  [place] [address] PCP: Horald Pollen, MD   Assessment & Plan: Visit Diagnoses:  1. Unilateral primary osteoarthritis, right knee     Plan: Knee injection performed right knee in an effort to get her some relief we will check her back in 6 weeks if she has persistent problems then knee arthroscopy with partial medial meniscectomy at the posterior root and removal of loose body and chondroplasty as needed would be considered.  Recheck 6 weeks.  Follow-Up Instructions: Return in about 6 weeks (around 10/06/2021).   Orders:  Orders Placed This Encounter  Procedures   Large Joint Inj: R knee   No orders of the defined types were placed in this encounter.     Procedures: Large Joint Inj: R knee on 08/25/2021 4:34 PM Indications: pain and joint swelling Details: 22 G 1.5 in needle, anterolateral approach  Arthrogram: No  Medications: 40 mg methylPREDNISolone acetate 40 MG/ML; 0.5 mL lidocaine 1 %; 4 mL bupivacaine 0.25 % Outcome: tolerated well, no immediate complications Procedure, treatment alternatives, risks and benefits explained, specific risks discussed. Consent was given by the patient. Immediately prior to procedure a time out was called to verify the correct patient, procedure, equipment, support staff and site/side marked as required. Patient was prepped and draped in the usual sterile fashion.       Clinical Data: No additional findings.   Subjective: Chief Complaint  Patient presents with   Right Knee - Pain, Follow-up    HPI 56 year old female returns with ongoing problems with right knee pain.  She states the pain has been persistent she is taking meloxicam which is helped to some degree.  She states at times her pain has been severe she  denies any locking but persistent pain and intermittent swelling.  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  Previous opposite left leg tibial stress fracture.  MRI scan has been obtained available for review.  This shows some knee osteoarthritis changes in the medial and patellofemoral compartment.  Small effusion with 6 mm loose body posterior joint line but she has not had any true locking.  Review of Systems all systems noncontributory HPI.   Objective: Vital Signs: BP (!) 135/92   Pulse 82   Ht 5' (1.524 m)   Wt 154 lb (69.9 kg)   LMP 10/30/2017   BMI 30.08 kg/m   Physical Exam Constitutional:      Appearance: She is well-developed.  HENT:     Head: Normocephalic.     Right Ear: External ear normal.     Left Ear: External ear normal. There is no impacted cerumen.  Eyes:     Pupils: Pupils are equal, round, and reactive to light.  Neck:     Thyroid: No thyromegaly.     Trachea: No tracheal deviation.  Cardiovascular:     Rate and Rhythm: Normal rate.  Pulmonary:     Effort: Pulmonary effort is normal.  Abdominal:     Palpations: Abdomen is soft.  Musculoskeletal:     Cervical back: No rigidity.  Skin:    General: Skin is warm and dry.  Neurological:  Mental Status: She is alert and oriented to person, place, and time.  Psychiatric:        Behavior: Behavior normal.     Ortho Exam  Specialty Comments:  No specialty comments available.  Imaging: Narrative & Impression  CLINICAL DATA:  Knee pain, stress fracture suspected, no prior imaging   EXAM: MRI OF THE RIGHT KNEE WITHOUT CONTRAST   TECHNIQUE: Multiplanar, multisequence MR imaging of the knee was performed. No intravenous contrast was administered.   COMPARISON:  X-ray 08/06/2021   FINDINGS: MENISCI   Medial meniscus: Medial meniscal posterior horn is ill-defined at the posterior root attachment site (series 11, images 10-11), suspicious for at least a partial tear. There is  mild extrusion of the meniscal body.   Lateral meniscus:  Intact.   LIGAMENTS   Cruciates: Intact ACL and PCL.   Collaterals: Intact MCL. Lateral collateral ligament complex intact.   CARTILAGE   Patellofemoral: Partial-thickness cartilage loss of the medial patellar facet.   Medial: Mild to moderate chondral thinning of the weight-bearing medial compartment.   Lateral:  No chondral defect.   MISCELLANEOUS   Joint: Small joint effusion. 6 mm loose body at the posterior joint line centrally. Fat pads within normal limits.   Popliteal Fossa:  No Baker's cyst. Intact popliteus tendon.   Extensor Mechanism:  Intact quadriceps and patellar tendons.   Bones: No acute fracture. No dislocation. Faint reactive bone marrow edema within the proximal tibia underlying the medial meniscal posterior root attachment site. No marrow replacing bone lesion.   Other: No significant periarticular soft tissue findings.   IMPRESSION: 1. Medial meniscal tear involving the posterior root attachment site. 2. Mild medial and patellofemoral compartment osteoarthritis. 3. Small joint effusion with 6 mm loose body at the posterior joint line.     Electronically Signed   By: Davina Poke D.O.   On: 08/10/2021 18:42     PMFS History: Patient Active Problem List   Diagnosis Date Noted   Unilateral primary osteoarthritis, right knee 08/26/2021   Dyspepsia 08/12/2021   Stress fracture of left tibia 10/14/2020   Vitamin D insufficiency 07/26/2015   Past Medical History:  Diagnosis Date   Allergy    GERD (gastroesophageal reflux disease)    Pre-diabetes    diet controlled    Vitamin D deficiency     Family History  Problem Relation Age of Onset   Hyperlipidemia Father    Thyroid disease Father    Alcohol abuse Neg Hx    Cancer Neg Hx    COPD Neg Hx    Depression Neg Hx    Diabetes Neg Hx    Drug abuse Neg Hx    Early death Neg Hx    Hearing loss Neg Hx    Heart disease  Neg Hx    Hypertension Neg Hx    Kidney disease Neg Hx    Stroke Neg Hx    Colon cancer Neg Hx    Colon polyps Neg Hx    Esophageal cancer Neg Hx    Rectal cancer Neg Hx    Stomach cancer Neg Hx     Past Surgical History:  Procedure Laterality Date   CESAREAN SECTION  2007   Social History   Occupational History   Not on file  Tobacco Use   Smoking status: Never   Smokeless tobacco: Never  Vaping Use   Vaping Use: Never used  Substance and Sexual Activity   Alcohol use: No    Alcohol/week:  0.0 standard drinks of alcohol   Drug use: No   Sexual activity: Yes    Partners: Male    Birth control/protection: Post-menopausal    Comment: 1st intercourse 39 yo-1 partner

## 2021-08-26 DIAGNOSIS — M1711 Unilateral primary osteoarthritis, right knee: Secondary | ICD-10-CM | POA: Insufficient documentation

## 2021-08-26 MED ORDER — LIDOCAINE HCL 1 % IJ SOLN
0.5000 mL | INTRAMUSCULAR | Status: AC | PRN
  Administered 2021-08-25: .5 mL

## 2021-08-26 MED ORDER — BUPIVACAINE HCL 0.25 % IJ SOLN
4.0000 mL | INTRAMUSCULAR | Status: AC | PRN
  Administered 2021-08-25: 4 mL via INTRA_ARTICULAR

## 2021-08-26 MED ORDER — METHYLPREDNISOLONE ACETATE 40 MG/ML IJ SUSP
40.0000 mg | INTRAMUSCULAR | Status: AC | PRN
  Administered 2021-08-25: 40 mg via INTRA_ARTICULAR

## 2021-09-08 ENCOUNTER — Ambulatory Visit (INDEPENDENT_AMBULATORY_CARE_PROVIDER_SITE_OTHER): Payer: 59

## 2021-09-08 ENCOUNTER — Other Ambulatory Visit: Payer: Self-pay | Admitting: Obstetrics & Gynecology

## 2021-09-08 DIAGNOSIS — Z13820 Encounter for screening for osteoporosis: Secondary | ICD-10-CM

## 2021-09-08 DIAGNOSIS — Z780 Asymptomatic menopausal state: Secondary | ICD-10-CM

## 2021-09-08 DIAGNOSIS — M8589 Other specified disorders of bone density and structure, multiple sites: Secondary | ICD-10-CM

## 2021-10-06 ENCOUNTER — Encounter: Payer: Self-pay | Admitting: Orthopaedic Surgery

## 2021-10-06 ENCOUNTER — Ambulatory Visit: Payer: Commercial Managed Care - HMO | Admitting: Orthopaedic Surgery

## 2021-10-06 VITALS — BP 122/87 | HR 76 | Ht 60.0 in | Wt 148.0 lb

## 2021-10-06 DIAGNOSIS — M23203 Derangement of unspecified medial meniscus due to old tear or injury, right knee: Secondary | ICD-10-CM | POA: Diagnosis not present

## 2021-10-06 DIAGNOSIS — M1711 Unilateral primary osteoarthritis, right knee: Secondary | ICD-10-CM | POA: Diagnosis not present

## 2021-10-06 DIAGNOSIS — M2341 Loose body in knee, right knee: Secondary | ICD-10-CM | POA: Diagnosis not present

## 2021-10-07 DIAGNOSIS — M2341 Loose body in knee, right knee: Secondary | ICD-10-CM | POA: Insufficient documentation

## 2021-10-07 DIAGNOSIS — M23209 Derangement of unspecified meniscus due to old tear or injury, unspecified knee: Secondary | ICD-10-CM | POA: Insufficient documentation

## 2021-10-07 NOTE — Progress Notes (Signed)
Office Visit Note   Patient: Emily Lara           Date of Birth: May 24, 1965           MRN: [id number] Visit Date: 10/06/2021              Requested by: Horald Pollen, [place],  [place] [address] PCP: Horald Pollen, MD   Assessment & Plan: Visit Diagnoses:  1. Unilateral primary osteoarthritis, right knee   2. Other old tear of medial meniscus of right knee   3. Loose body in knee, right knee     Plan: Patient's had persistent symptoms and has a loose body in her knee as well as posterior medial meniscal tear with some early arthritis in the medial compartment.  Long discussion with patient concerning arthroscopic results.  Removal of loose body and partial meniscectomy would give her some improvement in her right knee symptoms.  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  We discussed likely time out of work would be around 3 weeks.  We discussed arthroscopic procedure removal of loose body partial meniscectomy and any chondroplasty as needed for the medial compartment.  Questions were elicited and answered.  She would like to proceed.  Follow-Up Instructions: No follow-ups on file.   Orders:  No orders of the defined types were placed in this encounter.  No orders of the defined types were placed in this encounter.     Procedures: No procedures performed   Clinical Data: No additional findings.   Subjective: Chief Complaint  Patient presents with   Right Knee - Follow-up    HPI 56 year old female returns she is with a Freight forwarder at [employer] times many years and opens the store each morning.  She had previous right knee injection 08/25/2021 and states she got some relief from injection but mostly got relief staying off of it.  She is using Tylenol arthritis with some improvement.  Increased pain when she is walking is concerned since her job involves considerable amount of time on her  feet.  Previous x-rays that showed some bilateral medial joint line narrowing with mild spurring.  Right knee MRI scan 08/10/2021 showed medial meniscal posterior horn tear in the medial compartment chondral thinning as well as 6 mm loose body in the posterior joint line centrally.  Patient's had intermittent catching has had to grab objects but has not fallen.  She has not responded to anti-inflammatories as well as previous intra-articular injection. Review of Systems past remote history of a tibia stress fracture on the left.  History of vitamin D insufficiency.  All other systems noncontributory date HPI.  No cardiac or neurologic history.   Objective: Vital Signs: BP 122/87   Pulse 76   Ht 5' (1.524 m)   Wt 148 lb (67.1 kg)   LMP 10/30/2017   BMI 28.90 kg/m   Physical Exam Constitutional:      Appearance: She is well-developed.  HENT:     Head: Normocephalic.     Right Ear: External ear normal.     Left Ear: External ear normal. There is no impacted cerumen.  Eyes:     Pupils: Pupils are equal, round, and reactive to light.  Neck:     Thyroid: No thyromegaly.     Trachea: No tracheal deviation.  Cardiovascular:     Rate and Rhythm: Normal rate.  Pulmonary:  Effort: Pulmonary effort is normal.  Abdominal:     Palpations: Abdomen is soft.  Musculoskeletal:     Cervical back: No rigidity.  Skin:    General: Skin is warm and dry.  Neurological:     Mental Status: She is alert and oriented to person, place, and time.  Psychiatric:        Behavior: Behavior normal.     Ortho Exam patient has medial joint line tenderness tenderness to the posterior medial joint line and pain with hyperextension.  ACL PCL exam is normal negative pivot shift.  Negative logroll the hips distal pulses are normal no calf tenderness no Baker's cyst.  Tib-fib joint is normal pes bursa is normal.  Specialty Comments:  No specialty comments available.  Imaging: Narrative & Impression   CLINICAL DATA:  Knee pain, stress fracture suspected, no prior imaging   EXAM: MRI OF THE RIGHT KNEE WITHOUT CONTRAST   TECHNIQUE: Multiplanar, multisequence MR imaging of the knee was performed. No intravenous contrast was administered.   COMPARISON:  X-ray 08/06/2021   FINDINGS: MENISCI   Medial meniscus: Medial meniscal posterior horn is ill-defined at the posterior root attachment site (series 11, images 10-11), suspicious for at least a partial tear. There is mild extrusion of the meniscal body.   Lateral meniscus:  Intact.   LIGAMENTS   Cruciates: Intact ACL and PCL.   Collaterals: Intact MCL. Lateral collateral ligament complex intact.   CARTILAGE   Patellofemoral: Partial-thickness cartilage loss of the medial patellar facet.   Medial: Mild to moderate chondral thinning of the weight-bearing medial compartment.   Lateral:  No chondral defect.   MISCELLANEOUS   Joint: Small joint effusion. 6 mm loose body at the posterior joint line centrally. Fat pads within normal limits.   Popliteal Fossa:  No Baker's cyst. Intact popliteus tendon.   Extensor Mechanism:  Intact quadriceps and patellar tendons.   Bones: No acute fracture. No dislocation. Faint reactive bone marrow edema within the proximal tibia underlying the medial meniscal posterior root attachment site. No marrow replacing bone lesion.   Other: No significant periarticular soft tissue findings.   IMPRESSION: 1. Medial meniscal tear involving the posterior root attachment site. 2. Mild medial and patellofemoral compartment osteoarthritis. 3. Small joint effusion with 6 mm loose body at the posterior joint line.     Electronically Signed   By: Davina Poke D.O.   On: 08/10/2021 18:42     PMFS History: Patient Active Problem List   Diagnosis Date Noted   Chronic meniscal tear of knee 10/07/2021   Loose body in knee, right knee 10/07/2021   Unilateral primary osteoarthritis, right  knee 08/26/2021   Dyspepsia 08/12/2021   Stress fracture of left tibia 10/14/2020   Vitamin D insufficiency 07/26/2015   Past Medical History:  Diagnosis Date   Allergy    GERD (gastroesophageal reflux disease)    Pre-diabetes    diet controlled    Vitamin D deficiency     Family History  Problem Relation Age of Onset   Hyperlipidemia Father    Thyroid disease Father    Alcohol abuse Neg Hx    Cancer Neg Hx    COPD Neg Hx    Depression Neg Hx    Diabetes Neg Hx    Drug abuse Neg Hx    Early death Neg Hx    Hearing loss Neg Hx    Heart disease Neg Hx    Hypertension Neg Hx    Kidney disease  Neg Hx    Stroke Neg Hx    Colon cancer Neg Hx    Colon polyps Neg Hx    Esophageal cancer Neg Hx    Rectal cancer Neg Hx    Stomach cancer Neg Hx     Past Surgical History:  Procedure Laterality Date   CESAREAN SECTION  2007   Social History   Occupational History   Not on file  Tobacco Use   Smoking status: Never   Smokeless tobacco: Never  Vaping Use   Vaping Use: Never used  Substance and Sexual Activity   Alcohol use: No    Alcohol/week: 0.0 standard drinks of alcohol   Drug use: No   Sexual activity: Yes    Partners: Male    Birth control/protection: Post-menopausal    Comment: 1st intercourse 66 yo-1 partner

## 2021-11-12 ENCOUNTER — Ambulatory Visit (INDEPENDENT_AMBULATORY_CARE_PROVIDER_SITE_OTHER): Payer: Commercial Managed Care - HMO | Admitting: Emergency Medicine

## 2021-11-12 ENCOUNTER — Encounter: Payer: Self-pay | Admitting: Emergency Medicine

## 2021-11-12 VITALS — BP 124/82 | HR 76 | Temp 97.5°F | Ht 61.0 in | Wt 151.0 lb

## 2021-11-12 DIAGNOSIS — R1013 Epigastric pain: Secondary | ICD-10-CM | POA: Diagnosis not present

## 2021-11-12 DIAGNOSIS — R8290 Unspecified abnormal findings in urine: Secondary | ICD-10-CM

## 2021-11-12 DIAGNOSIS — Z23 Encounter for immunization: Secondary | ICD-10-CM

## 2021-11-12 LAB — URINALYSIS
Bilirubin Urine: NEGATIVE
Ketones, ur: NEGATIVE
Leukocytes,Ua: NEGATIVE
Nitrite: NEGATIVE
Specific Gravity, Urine: 1.02 (ref 1.000–1.030)
Total Protein, Urine: NEGATIVE
Urine Glucose: NEGATIVE
Urobilinogen, UA: 0.2 (ref 0.0–1.0)
pH: 6 (ref 5.0–8.0)

## 2021-11-12 MED ORDER — PANTOPRAZOLE SODIUM 40 MG PO TBEC: 40.0000 mg | DELAYED_RELEASE_TABLET | Freq: Every day | ORAL | 1 refills | Status: DC

## 2021-11-12 MED ORDER — PANTOPRAZOLE SODIUM 40 MG PO TBEC: 40.0000 mg | DELAYED_RELEASE_TABLET | Freq: Every day | ORAL | 3 refills | Status: DC

## 2021-11-12 NOTE — Progress Notes (Signed)
Emily Lara 56 y.o.   No chief complaint on file.   HISTORY OF PRESENT ILLNESS: This is a 56 y.o. female here for 3-monthfollow-up of dyspepsia. Pantoprazole 40 mg daily working very well for her.  Asymptomatic.  Much improved. Has no complaint or medical concerns today.  HPI   Prior to Admission medications   Medication Sig Start Date End Date Taking? Authorizing Provider  Ascorbic Acid (VITAMIN C) 1000 MG tablet Take 1,000 mg by mouth daily.   Yes [provider]  meloxicam (MOBIC) 15 MG tablet Take 1 tablet (15 mg total) by mouth daily. 01/08/21  Yes McDonald, AStephan Minister DPM  Multiple Vitamin (MULTIVITAMIN) tablet Take 1 tablet by mouth daily.   Yes [provider]  RESTASIS 0.05 % ophthalmic emulsion 1 drop 2 (two) times daily. 10/21/20  Yes [provider]  VITAMIN D PO Take 2,000 Units by mouth daily.   Yes [provider]  pantoprazole (PROTONIX) 40 MG tablet Take 1 tablet (40 mg total) by mouth daily. 11/12/21   SHorald Pollen MD    No Known Allergies  Patient Active Problem List   Diagnosis Date Noted   Chronic meniscal tear of knee 10/07/2021   Loose body in knee, right knee 10/07/2021   Unilateral primary osteoarthritis, right knee 08/26/2021   Dyspepsia 08/12/2021   Stress fracture of left tibia 10/14/2020   Vitamin D insufficiency 07/26/2015    Past Medical History:  Diagnosis Date   Allergy    GERD (gastroesophageal reflux disease)    Pre-diabetes    diet controlled    Vitamin D deficiency     Past Surgical History:  Procedure Laterality Date   CESAREAN SECTION  2007    Social History   Socioeconomic History   Marital status: Married    Spouse name: Not on file   Number of children: Not on file   Years of education: Not on file   Highest education level: Not on file  Occupational History   Not on file  Tobacco Use   Smoking status: Never   Smokeless tobacco: Never  Vaping Use   Vaping Use: Never  used  Substance and Sexual Activity   Alcohol use: No    Alcohol/week: 0.0 standard drinks of alcohol   Drug use: No   Sexual activity: Yes    Partners: Male    Birth control/protection: Post-menopausal    Comment: 1st intercourse 89yo-1 partner  Other Topics Concern   Not on file  Social History Narrative   Not on file   Social Determinants of Health   Financial Resource Strain: Not on file  Food Insecurity: Not on file  Transportation Needs: Not on file  Physical Activity: Not on file  Stress: Not on file  Social Connections: Not on file  Intimate Partner Violence: Not on file    Family History  Problem Relation Age of Onset   Hyperlipidemia Father    Thyroid disease Father    Alcohol abuse Neg Hx    Cancer Neg Hx    COPD Neg Hx    Depression Neg Hx    Diabetes Neg Hx    Drug abuse Neg Hx    Early death Neg Hx    Hearing loss Neg Hx    Heart disease Neg Hx    Hypertension Neg Hx    Kidney disease Neg Hx    Stroke Neg Hx    Colon cancer Neg Hx    Colon polyps  Neg Hx    Esophageal cancer Neg Hx    Rectal cancer Neg Hx    Stomach cancer Neg Hx      Review of Systems  Constitutional: Negative.  Negative for chills and fever.  HENT: Negative.    Respiratory: Negative.    Cardiovascular: Negative.  Negative for chest pain and palpitations.  Gastrointestinal:  Negative for abdominal pain, blood in stool, constipation, diarrhea, melena, nausea and vomiting.  Genitourinary: Negative.   Skin: Negative.  Negative for rash.  Neurological: Negative.  Negative for dizziness, tingling and headaches.  All other systems reviewed and are negative.  Today's Vitals   11/12/21 1515  BP: 124/82  Pulse: 76  Temp: (!) 97.5 F (36.4 C)  TempSrc: Oral  SpO2: 99%  Weight: 151 lb (68.5 kg)  Height: '5\' 1"'$  (1.549 m)   Body mass index is 28.53 kg/m.  Physical Exam Vitals reviewed.  Constitutional:      Appearance: Normal appearance.  HENT:     Head: Normocephalic.   Eyes:     Extraocular Movements: Extraocular movements intact.     Pupils: Pupils are equal, round, and reactive to light.  Cardiovascular:     Rate and Rhythm: Normal rate and regular rhythm.     Pulses: Normal pulses.     Heart sounds: Normal heart sounds.  Pulmonary:     Effort: Pulmonary effort is normal.     Breath sounds: Normal breath sounds.  Abdominal:     Palpations: Abdomen is soft.     Tenderness: There is no abdominal tenderness.  Musculoskeletal:        General: Normal range of motion.  Skin:    General: Skin is warm and dry.  Neurological:     General: No focal deficit present.     Mental Status: She is alert and oriented to person, place, and time.  Psychiatric:        Mood and Affect: Mood normal.        Behavior: Behavior normal.    ASSESSMENT & PLAN: A total of 35 minutes was spent with the patient and counseling/coordination of care regarding preparing for this visit, review of most recent office visit notes, review of most recent blood work results, diagnosis of dyspepsia and treatment, review of all medications, education on nutrition, prognosis, documentation and need for follow-up.  Problem List Items Addressed This Visit       Other   Dyspepsia - Primary    Much improved on pantoprazole 40 mg daily. We will continue for 3 more months. May need upper endoscopy in the near future. Diet and nutrition discussed.      Relevant Medications   pantoprazole (PROTONIX) 40 MG tablet   Other Visit Diagnoses     Abnormal urinalysis       Relevant Orders   Urine Culture   Urinalysis (Completed)      Patient Instructions  Gastritis, Adult Gastritis is irritation and swelling (inflammation) of the stomach. There are two kinds of gastritis: Acute gastritis. This kind develops quickly. Chronic gastritis. This kind is much more common. It develops slowly and lasts for a long time. It is important to get help for this condition. If you do not get help,  your stomach can bleed, and you can get sores (ulcers) in your stomach. What are the causes? This condition may be caused by: Germs that get to your stomach and cause an infection. Drinking too much alcohol. Medicines you are taking. Having too much acid  in the stomach. Having a disease of the stomach. Other causes may include: An allergic reaction. Some cancer treatments (radiation). Smoking cigarettes or using products that contain nicotine or tobacco. In some cases, the cause of this condition is not known. What increases the risk? Having a disease of the intestines. Having Crohn's disease. Using aspirin or ibuprofen and other NSAIDs to treat other conditions. Stress. What are the signs or symptoms? Pain in your stomach. A burning feeling in your stomach. Feeling like you may vomit (nauseous). Vomiting or vomiting blood. Feeling too full after you eat. Weight loss. Bad breath. Blood in your poop (stool). In some cases, there are no symptoms. How is this treated? This condition is treated with medicines. The medicines that are used depend on what caused the condition. You may be given: Antibiotic medicine, if your condition was caused by an infection from germs. H2 blockers and similar medicines, if your condition was caused by too much acid in the stomach. Treatment may also include stopping the use of certain medicines, such as aspirin or ibuprofen. Follow these instructions at home: Medicines Take over-the-counter and prescription medicines only as told by your doctor. If you were prescribed an antibiotic medicine, take it as told by your doctor. Do not stop taking it even if you start to feel better. Alcohol use Do not drink alcohol if: Your doctor tells you not to drink. You are pregnant, may be pregnant, or are planning to become pregnant. If you drink alcohol: Limit your use to: 0-1 drink a day for women. 0-2 drinks a day for men. Know how much alcohol is in your  drink. In the [place] one drink equals one 12 oz bottle of beer (355 mL), one 5 oz glass of wine (148 mL), or one 1 oz glass of hard liquor (44 mL). General instructions  Eat small meals often, instead of large meals. Avoid foods and drinks that make you feel worse. Drink enough fluid to keep your pee (urine) pale yellow. Talk with your doctor about ways to manage stress. You can exercise or do deep breathing, meditation, or yoga. Do not smoke or use any products that contain nicotine or tobacco. If you need help quitting, ask your doctor. Keep all follow-up visits. Contact a doctor if: Your symptoms get worse. Your stomach pain gets worse. Your symptoms go away and then come back. You have a fever. Get help right away if: You vomit blood or something that looks like coffee grounds. You have black or dark red poop. You throw up any time you try to drink fluids. These symptoms may be an emergency. Get help right away. Call your local emergency services (911 in the [place] Do not wait to see if the symptoms will go away. Do not drive yourself to the hospital. Summary Gastritis is irritation and swelling (inflammation) of the stomach. You must get help for this condition. If you do not get help, your stomach can bleed, and you can get sores (ulcers) in your stomach. You can be treated with medicines for germs or medicines to block too much acid in your stomach. This information is not intended to replace advice given to you by your health care provider. Make sure you discuss any questions you have with your health care provider. Document Revised: 06/07/2020 Document Reviewed: 06/07/2020 Elsevier Patient Education  [place], MD [facility]

## 2021-11-12 NOTE — Assessment & Plan Note (Signed)
Much improved on pantoprazole 40 mg daily. We will continue for 3 more months. May need upper endoscopy in the near future. Diet and nutrition discussed.

## 2021-11-12 NOTE — Patient Instructions (Signed)
Gastritis, Adult Gastritis is irritation and swelling (inflammation) of the stomach. There are two kinds of gastritis: Acute gastritis. This kind develops quickly. Chronic gastritis. This kind is much more common. It develops slowly and lasts for a long time. It is important to get help for this condition. If you do not get help, your stomach can bleed, and you can get sores (ulcers) in your stomach. What are the causes? This condition may be caused by: Germs that get to your stomach and cause an infection. Drinking too much alcohol. Medicines you are taking. Having too much acid in the stomach. Having a disease of the stomach. Other causes may include: An allergic reaction. Some cancer treatments (radiation). Smoking cigarettes or using products that contain nicotine or tobacco. In some cases, the cause of this condition is not known. What increases the risk? Having a disease of the intestines. Having Crohn's disease. Using aspirin or ibuprofen and other NSAIDs to treat other conditions. Stress. What are the signs or symptoms? Pain in your stomach. A burning feeling in your stomach. Feeling like you may vomit (nauseous). Vomiting or vomiting blood. Feeling too full after you eat. Weight loss. Bad breath. Blood in your poop (stool). In some cases, there are no symptoms. How is this treated? This condition is treated with medicines. The medicines that are used depend on what caused the condition. You may be given: Antibiotic medicine, if your condition was caused by an infection from germs. H2 blockers and similar medicines, if your condition was caused by too much acid in the stomach. Treatment may also include stopping the use of certain medicines, such as aspirin or ibuprofen. Follow these instructions at home: Medicines Take over-the-counter and prescription medicines only as told by your doctor. If you were prescribed an antibiotic medicine, take it as told by your doctor.  Do not stop taking it even if you start to feel better. Alcohol use Do not drink alcohol if: Your doctor tells you not to drink. You are pregnant, may be pregnant, or are planning to become pregnant. If you drink alcohol: Limit your use to: 0-1 drink a day for women. 0-2 drinks a day for men. Know how much alcohol is in your drink. In the [place] one drink equals one 12 oz bottle of beer (355 mL), one 5 oz glass of wine (148 mL), or one 1 oz glass of hard liquor (44 mL). General instructions  Eat small meals often, instead of large meals. Avoid foods and drinks that make you feel worse. Drink enough fluid to keep your pee (urine) pale yellow. Talk with your doctor about ways to manage stress. You can exercise or do deep breathing, meditation, or yoga. Do not smoke or use any products that contain nicotine or tobacco. If you need help quitting, ask your doctor. Keep all follow-up visits. Contact a doctor if: Your symptoms get worse. Your stomach pain gets worse. Your symptoms go away and then come back. You have a fever. Get help right away if: You vomit blood or something that looks like coffee grounds. You have black or dark red poop. You throw up any time you try to drink fluids. These symptoms may be an emergency. Get help right away. Call your local emergency services (911 in the [place] Do not wait to see if the symptoms will go away. Do not drive yourself to the hospital. Summary Gastritis is irritation and swelling (inflammation) of the stomach. You must get help for this condition. If you do   not get help, your stomach can bleed, and you can get sores (ulcers) in your stomach. You can be treated with medicines for germs or medicines to block too much acid in your stomach. This information is not intended to replace advice given to you by your health care provider. Make sure you discuss any questions you have with your health care provider. Document Revised: 06/07/2020 Document  Reviewed: 06/07/2020 Elsevier Patient Education  2023 Elsevier Inc.  

## 2021-11-15 LAB — URINE CULTURE

## 2021-11-17 NOTE — Progress Notes (Signed)
Asymptomatic bacteriuria.  No need for antibiotics.

## 2021-11-19 ENCOUNTER — Emergency Department (HOSPITAL_COMMUNITY): Payer: Commercial Managed Care - HMO

## 2021-11-19 ENCOUNTER — Other Ambulatory Visit: Payer: Self-pay

## 2021-11-19 ENCOUNTER — Encounter (HOSPITAL_COMMUNITY): Payer: Self-pay

## 2021-11-19 ENCOUNTER — Emergency Department (HOSPITAL_COMMUNITY)
Admission: EM | Admit: 2021-11-19 | Discharge: 2021-11-20 | Disposition: A | Discharge status: Home / Self Care | Payer: Commercial Managed Care - HMO | Attending: Emergency Medicine | Admitting: Emergency Medicine

## 2021-11-19 DIAGNOSIS — M545 Low back pain, unspecified: Secondary | ICD-10-CM | POA: Insufficient documentation

## 2021-11-19 LAB — BASIC METABOLIC PANEL
Anion gap: 7 (ref 5–15)
BUN: 11 mg/dL (ref 6–20)
CO2: 26 mmol/L (ref 22–32)
Calcium: 9.4 mg/dL (ref 8.9–10.3)
Chloride: 107 mmol/L (ref 98–111)
Creatinine, Ser: 0.53 mg/dL (ref 0.44–1.00)
GFR, Estimated: >60 mL/min (ref >60)
Glucose, Bld: 112 mg/dL — ABNORMAL HIGH (ref 70–99)
Potassium: 3.3 mmol/L — ABNORMAL LOW (ref 3.5–5.1)
Sodium: 140 mmol/L (ref 135–145)

## 2021-11-19 LAB — URINALYSIS, ROUTINE W REFLEX MICROSCOPIC
Bilirubin Urine: NEGATIVE
Glucose, UA: NEGATIVE mg/dL
Hgb urine dipstick: NEGATIVE
Ketones, ur: NEGATIVE mg/dL
Leukocytes,Ua: NEGATIVE
Nitrite: POSITIVE — AB
Protein, ur: NEGATIVE mg/dL
Specific Gravity, Urine: 1.01 (ref 1.005–1.030)
pH: 6 (ref 5.0–8.0)

## 2021-11-19 LAB — CBC
HCT: 41.5 % (ref 36.0–46.0)
Hemoglobin: 13.9 g/dL (ref 12.0–15.0)
MCH: 31.6 pg (ref 26.0–34.0)
MCHC: 33.5 g/dL (ref 30.0–36.0)
MCV: 94.3 fL (ref 80.0–100.0)
Platelets: 273 10*3/uL (ref 150–400)
RBC: 4.4 MIL/uL (ref 3.87–5.11)
RDW: 13.1 % (ref 11.5–15.5)
WBC: 8.7 10*3/uL (ref 4.0–10.5)
nRBC: 0 % (ref 0.0–0.2)

## 2021-11-19 LAB — PREGNANCY, URINE: Preg Test, Ur: NEGATIVE

## 2021-11-19 NOTE — ED Triage Notes (Signed)
Back pain for a few days. Pt tripped last night. Breathing makes the pain worse. Denies numbness/tingling or incontinence.

## 2021-11-19 NOTE — ED Provider Triage Note (Signed)
Emergency Medicine Provider Triage Evaluation Note  Emily Lara , a 56 y.o. female  was evaluated in triage.  Pt complains of low back pain.  Patient reports back pain is been ongoing for 3 days.  The patient states pain is constant.  The patient reports that the pain began after cleaning the house all day.  The patient denies any red flag symptoms low back pain.  Patient states that she fell last night for unknown reasons.  The patient denies dysuria.  Review of Systems  Positive:  Negative:   Physical Exam  BP (!) 137/94 (BP Location: Right Arm)   Pulse 78   Temp 98.2 F (36.8 C) (Oral)   Resp 18   Ht '5\' 1"'$  (1.549 m)   Wt 68.5 kg   LMP 10/30/2017   SpO2 98%   BMI 28.53 kg/m  Gen:   Awake, no distress   Resp:  Normal effort  MSK:   Moves extremities without difficulty  Other:    Medical Decision Making  Medically screening exam initiated at 3:04 PM.  Appropriate orders placed.  Danel Jerilynn Mages Mourer was informed that the remainder of the evaluation will be completed by another provider, this initial triage assessment does not replace that evaluation, and the importance of remaining in the ED until their evaluation is complete.     Azucena Cecil, PA-C 11/19/21 1504

## 2021-11-20 MED ORDER — IBUPROFEN 400 MG PO TABS
400.0000 mg | ORAL_TABLET | Freq: Once | ORAL | Status: AC
  Administered 2021-11-20: 400 mg via ORAL
  Filled 2021-11-20: qty 1

## 2021-11-20 MED ORDER — OXYCODONE-ACETAMINOPHEN 5-325 MG PO TABS
1.0000 | ORAL_TABLET | Freq: Once | ORAL | Status: AC
  Administered 2021-11-20: 1 via ORAL
  Filled 2021-11-20: qty 1

## 2021-11-20 MED ORDER — IBUPROFEN 800 MG PO TABS: 800.0000 mg | ORAL_TABLET | Freq: Three times a day (TID) | ORAL | 0 refills | Status: AC | PRN

## 2021-11-20 MED ORDER — METHOCARBAMOL 500 MG PO TABS: 500.0000 mg | ORAL_TABLET | Freq: Three times a day (TID) | ORAL | 0 refills | Status: AC | PRN

## 2021-11-20 NOTE — ED Provider Notes (Signed)
[facility] [address] EMERGENCY DEPARTMENT Provider Note   CSN: [id number] Arrival date & time: 11/19/21  1232     History  Chief Complaint  Patient presents with   Back Pain    Emily Lara is a 56 y.o. female.  Patient presents to the emergency department for evaluation of back pain.  Symptoms ongoing for 3 days.  Pain is in bilateral lower back, no midline pain.  Pain does not radiate to legs.  No loss of strength or sensation in lower extremities, no change in bowel or bladder function.  She has not noticed any urinary symptoms.  She did fall yesterday, was able to get back up and did not have any increase in her pain.       Home Medications Prior to Admission medications   Medication Sig Start Date End Date Taking? Authorizing Provider  ibuprofen (ADVIL) 800 MG tablet Take 1 tablet (800 mg total) by mouth every 8 (eight) hours as needed for moderate pain. 11/20/21  Yes Patina Spanier, Gwenyth Allegra, MD  methocarbamol (ROBAXIN) 500 MG tablet Take 1 tablet (500 mg total) by mouth every 8 (eight) hours as needed for muscle spasms. 11/20/21  Yes Stevi Hollinshead, Gwenyth Allegra, MD  Ascorbic Acid (VITAMIN C) 1000 MG tablet Take 1,000 mg by mouth daily.    [provider]  meloxicam (MOBIC) 15 MG tablet Take 1 tablet (15 mg total) by mouth daily. 01/08/21   Criselda Peaches, DPM  Multiple Vitamin (MULTIVITAMIN) tablet Take 1 tablet by mouth daily.    [provider]  pantoprazole (PROTONIX) 40 MG tablet Take 1 tablet (40 mg total) by mouth daily. 11/12/21   Horald Pollen, MD  RESTASIS 0.05 % ophthalmic emulsion 1 drop 2 (two) times daily. 10/21/20   [provider]  VITAMIN D PO Take 2,000 Units by mouth daily.    [provider]      Allergies    Patient has no known allergies.    Review of Systems   Review of Systems  Physical Exam Updated Vital Signs BP (!) 138/91 (BP Location: Left Arm)   Pulse 67   Temp 98.2 F (36.8 C)   Resp 18    Ht '5\' 1"'$  (1.549 m)   Wt 68.5 kg   LMP 10/30/2017   SpO2 98%   BMI 28.53 kg/m  Physical Exam Vitals and nursing note reviewed.  Constitutional:      General: She is not in acute distress.    Appearance: She is well-developed.  HENT:     Head: Normocephalic and atraumatic.     Mouth/Throat:     Mouth: Mucous membranes are moist.  Eyes:     General: Vision grossly intact. Gaze aligned appropriately.     Extraocular Movements: Extraocular movements intact.     Conjunctiva/sclera: Conjunctivae normal.  Cardiovascular:     Rate and Rhythm: Normal rate and regular rhythm.     Pulses: Normal pulses.     Heart sounds: Normal heart sounds, S1 normal and S2 normal. No murmur heard.    No friction rub. No gallop.  Pulmonary:     Effort: Pulmonary effort is normal. No respiratory distress.     Breath sounds: Normal breath sounds.  Abdominal:     General: Bowel sounds are normal.     Palpations: Abdomen is soft.     Tenderness: There is no abdominal tenderness. There is no guarding or rebound.     Hernia: No hernia is present.  Musculoskeletal:  General: No swelling.     Cervical back: Full passive range of motion without pain, normal range of motion and neck supple. No spinous process tenderness or muscular tenderness. Normal range of motion.     Lumbar back: Tenderness present. No bony tenderness. Negative right straight leg raise test and negative left straight leg raise test.       Back:     Right lower leg: No edema.     Left lower leg: No edema.  Skin:    General: Skin is warm and dry.     Capillary Refill: Capillary refill takes less than 2 seconds.     Findings: No ecchymosis, erythema, rash or wound.  Neurological:     General: No focal deficit present.     Mental Status: She is alert and oriented to person, place, and time.     GCS: GCS eye subscore is 4. GCS verbal subscore is 5. GCS motor subscore is 6.     Cranial Nerves: Cranial nerves 2-12 are intact.      Sensory: Sensation is intact.     Motor: Motor function is intact.     Coordination: Coordination is intact.  Psychiatric:        Attention and Perception: Attention normal.        Mood and Affect: Mood normal.        Speech: Speech normal.        Behavior: Behavior normal.     ED Results / Procedures / Treatments   Labs (all labs ordered are listed, but only abnormal results are displayed) Labs Reviewed  BASIC METABOLIC PANEL - Abnormal; Notable for the following components:      Result Value   Potassium 3.3 (*)    Glucose, Bld 112 (*)    All other components within normal limits  URINALYSIS, ROUTINE W REFLEX MICROSCOPIC - Abnormal; Notable for the following components:   APPearance HAZY (*)    Nitrite POSITIVE (*)    Bacteria, UA MANY (*)    All other components within normal limits  CBC  PREGNANCY, URINE    EKG None  Radiology DG Lumbar Spine Complete  Result Date: 11/19/2021 CLINICAL DATA:  Lumbar pain. EXAM: LUMBAR SPINE - COMPLETE 4+ VIEW COMPARISON:  None Available. FINDINGS: There are 5 non-rib-bearing lumbar-type vertebral bodies. Minimal levocurvature centered at T12-L1. No sagittal spondylolisthesis. Vertebral body heights are maintained. Mild L5-S1, mild left L4-5, and minimal right L2-3 disc space narrowing. Mild bilateral inferior sacroiliac joint space narrowing and subchondral sclerosis. IMPRESSION: Mild degenerative disc changes, greatest at L5-S1. Electronically Signed   By: Yvonne Kendall M.D.   On: 11/19/2021 15:39    Procedures Procedures    Medications Ordered in ED Medications  ibuprofen (ADVIL) tablet 400 mg (has no administration in time range)  oxyCODONE-acetaminophen (PERCOCET/ROXICET) 5-325 MG per tablet 1 tablet (has no administration in time range)    ED Course/ Medical Decision Making/ A&P                           Medical Decision Making  Presents with low back pain.  Pain is not midline, nonradicular.  No red flags to be concern for  cauda equina syndrome, herniated disc or other neurosurgical emergency.  She has soft tissue paraspinal tenderness bilaterally, negative straight leg raises.  Strength and sensation is normal in both lower extremities.  No saddle anesthesia.  No foot drop.  Lab work unremarkable.  X-ray of lumbar  spine performed because of a fall yesterday.  No fracture noted.  Treat for musculoskeletal pain.        Final Clinical Impression(s) / ED Diagnoses Final diagnoses:  Acute bilateral low back pain without sciatica    Rx / DC Orders ED Discharge Orders          Ordered    ibuprofen (ADVIL) 800 MG tablet  Every 8 hours PRN        11/20/21 0126    methocarbamol (ROBAXIN) 500 MG tablet  Every 8 hours PRN        11/20/21 0126              Orpah Greek, MD 11/20/21 0127

## 2021-11-23 ENCOUNTER — Telehealth: Payer: Self-pay | Admitting: *Deleted

## 2021-11-23 NOTE — Telephone Encounter (Signed)
Transition Care Management Unsuccessful Follow-up Telephone Call  Date of discharge and from where:  11/20/2021 [facility] ED  Attempts:  1st Attempt  Reason for unsuccessful TCM follow-up call:  Left voice message

## 2021-11-25 ENCOUNTER — Ambulatory Visit: Payer: Commercial Managed Care - HMO | Admitting: Emergency Medicine

## 2022-04-27 DIAGNOSIS — Z1231 Encounter for screening mammogram for malignant neoplasm of breast: Secondary | ICD-10-CM

## 2022-05-11 ENCOUNTER — Other Ambulatory Visit: Payer: Self-pay | Admitting: Emergency Medicine

## 2022-05-11 DIAGNOSIS — Z1231 Encounter for screening mammogram for malignant neoplasm of breast: Secondary | ICD-10-CM

## 2022-05-18 ENCOUNTER — Ambulatory Visit (INDEPENDENT_AMBULATORY_CARE_PROVIDER_SITE_OTHER): Payer: 59 | Admitting: Emergency Medicine

## 2022-05-18 ENCOUNTER — Encounter: Payer: Self-pay | Admitting: Emergency Medicine

## 2022-05-18 VITALS — BP 120/82 | HR 81 | Temp 98.2°F | Ht 61.0 in | Wt 153.1 lb

## 2022-05-18 DIAGNOSIS — Z1329 Encounter for screening for other suspected endocrine disorder: Secondary | ICD-10-CM | POA: Diagnosis not present

## 2022-05-18 DIAGNOSIS — R1013 Epigastric pain: Secondary | ICD-10-CM

## 2022-05-18 DIAGNOSIS — Z13220 Encounter for screening for lipoid disorders: Secondary | ICD-10-CM

## 2022-05-18 DIAGNOSIS — Z0000 Encounter for general adult medical examination without abnormal findings: Secondary | ICD-10-CM

## 2022-05-18 DIAGNOSIS — Z0001 Encounter for general adult medical examination with abnormal findings: Secondary | ICD-10-CM

## 2022-05-18 DIAGNOSIS — Z13228 Encounter for screening for other metabolic disorders: Secondary | ICD-10-CM | POA: Diagnosis not present

## 2022-05-18 DIAGNOSIS — Z130 Encounter for screening for diseases of the blood and blood-forming organs and certain disorders involving the immune mechanism: Secondary | ICD-10-CM

## 2022-05-18 LAB — CBC WITH DIFFERENTIAL/PLATELET
Basophils Absolute: 0.1 10*3/uL (ref 0.0–0.1)
Basophils Relative: 0.7 % (ref 0.0–3.0)
Eosinophils Absolute: 0.2 10*3/uL (ref 0.0–0.7)
Eosinophils Relative: 3.1 % (ref 0.0–5.0)
HCT: 39.8 % (ref 36.0–46.0)
Hemoglobin: 13.2 g/dL (ref 12.0–15.0)
Lymphocytes Relative: 44 % (ref 12.0–46.0)
Lymphs Abs: 3.3 10*3/uL (ref 0.7–4.0)
MCHC: 33 g/dL (ref 30.0–36.0)
MCV: 93.3 fl (ref 78.0–100.0)
Monocytes Absolute: 0.4 10*3/uL (ref 0.1–1.0)
Monocytes Relative: 5.9 % (ref 3.0–12.0)
Neutro Abs: 3.5 10*3/uL (ref 1.4–7.7)
Neutrophils Relative %: 46.3 % (ref 43.0–77.0)
Platelets: 271 10*3/uL (ref 150.0–400.0)
RBC: 4.27 Mil/uL (ref 3.87–5.11)
RDW: 13.9 % (ref 11.5–15.5)
WBC: 7.6 10*3/uL (ref 4.0–10.5)

## 2022-05-18 LAB — TSH: TSH: 2.52 u[IU]/mL (ref 0.35–5.50)

## 2022-05-18 LAB — HEMOGLOBIN A1C: Hgb A1c MFr Bld: 6 % (ref 4.6–6.5)

## 2022-05-18 MED ORDER — PANTOPRAZOLE SODIUM 40 MG PO TBEC: 40.0000 mg | DELAYED_RELEASE_TABLET | Freq: Every day | ORAL | 1 refills | Status: DC

## 2022-05-18 MED ORDER — PANTOPRAZOLE SODIUM 40 MG PO TBEC: 40.0000 mg | DELAYED_RELEASE_TABLET | Freq: Every day | ORAL | 1 refills | Status: AC

## 2022-05-18 NOTE — Progress Notes (Signed)
Emily Lara 57 y.o.   Chief Complaint  Patient presents with   Annual Exam    No concerns     HISTORY OF PRESENT ILLNESS: This is a 57 y.o. female here for annual exam. Doing well.  Has no complaints or medical concerns today.  HPI   Prior to Admission medications   Medication Sig Start Date End Date Taking? Authorizing Provider  Ascorbic Acid (VITAMIN C) 1000 MG tablet Take 1,000 mg by mouth daily.   Yes [provider]  methocarbamol (ROBAXIN) 500 MG tablet Take 1 tablet (500 mg total) by mouth every 8 (eight) hours as needed for muscle spasms. 11/20/21  Yes Pollina, Gwenyth Allegra, MD  Multiple Vitamin (MULTIVITAMIN) tablet Take 1 tablet by mouth daily.   Yes [provider]  RESTASIS 0.05 % ophthalmic emulsion 1 drop 2 (two) times daily. 10/21/20  Yes [provider]  VITAMIN D PO Take 2,000 Units by mouth daily.   Yes [provider]  ibuprofen (ADVIL) 800 MG tablet Take 1 tablet (800 mg total) by mouth every 8 (eight) hours as needed for moderate pain. Patient not taking: Reported on 05/18/2022 11/20/21   Orpah Greek, MD  meloxicam (MOBIC) 15 MG tablet Take 1 tablet (15 mg total) by mouth daily. Patient not taking: Reported on 05/18/2022 01/08/21   Criselda Peaches, DPM  pantoprazole (PROTONIX) 40 MG tablet Take 1 tablet (40 mg total) by mouth daily. 05/18/22   Horald Pollen, MD    No Known Allergies  Patient Active Problem List   Diagnosis Date Noted   Chronic meniscal tear of knee 10/07/2021   Loose body in knee, right knee 10/07/2021   Unilateral primary osteoarthritis, right knee 08/26/2021   Dyspepsia 08/12/2021   Vitamin D insufficiency 07/26/2015    Past Medical History:  Diagnosis Date   Allergy    GERD (gastroesophageal reflux disease)    Pre-diabetes    diet controlled    Vitamin D deficiency     Past Surgical History:  Procedure Laterality Date   CESAREAN SECTION  2007    Social History    Socioeconomic History   Marital status: Married    Spouse name: Not on file   Number of children: Not on file   Years of education: Not on file   Highest education level: Not on file  Occupational History   Not on file  Tobacco Use   Smoking status: Never   Smokeless tobacco: Never  Vaping Use   Vaping Use: Never used  Substance and Sexual Activity   Alcohol use: No    Alcohol/week: 0.0 standard drinks of alcohol   Drug use: No   Sexual activity: Yes    Partners: Male    Birth control/protection: Post-menopausal    Comment: 1st intercourse 89 yo-1 partner  Other Topics Concern   Not on file  Social History Narrative   Not on file   Social Determinants of Health   Financial Resource Strain: Not on file  Food Insecurity: Not on file  Transportation Needs: Not on file  Physical Activity: Not on file  Stress: Not on file  Social Connections: Not on file  Intimate Partner Violence: Not on file    Family History  Problem Relation Age of Onset   Hyperlipidemia Father    Thyroid disease Father    Alcohol abuse Neg Hx    Cancer Neg Hx    COPD Neg Hx    Depression Neg Hx  Diabetes Neg Hx    Drug abuse Neg Hx    Early death Neg Hx    Hearing loss Neg Hx    Heart disease Neg Hx    Hypertension Neg Hx    Kidney disease Neg Hx    Stroke Neg Hx    Colon cancer Neg Hx    Colon polyps Neg Hx    Esophageal cancer Neg Hx    Rectal cancer Neg Hx    Stomach cancer Neg Hx      Review of Systems  Constitutional: Negative.  Negative for chills, fever and weight loss.  HENT: Negative.  Negative for congestion and sore throat.   Respiratory: Negative.  Negative for cough and shortness of breath.   Cardiovascular: Negative.  Negative for chest pain and palpitations.  Gastrointestinal:  Positive for heartburn.  Genitourinary: Negative.  Negative for dysuria and hematuria.  Skin: Negative.  Negative for rash.  Neurological: Negative.  Negative for dizziness and  headaches.  All other systems reviewed and are negative.   Vitals:   05/18/22 1444  BP: 120/82  Pulse: 81  Temp: 98.2 F (36.8 C)  SpO2: 94%    Physical Exam Vitals reviewed.  Constitutional:      Appearance: Normal appearance.  HENT:     Head: Normocephalic.     Right Ear: Tympanic membrane, ear canal and external ear normal.     Left Ear: Tympanic membrane, ear canal and external ear normal.  Eyes:     Extraocular Movements: Extraocular movements intact.     Conjunctiva/sclera: Conjunctivae normal.     Pupils: Pupils are equal, round, and reactive to light.  Cardiovascular:     Rate and Rhythm: Normal rate and regular rhythm.     Pulses: Normal pulses.     Heart sounds: Normal heart sounds.  Pulmonary:     Effort: Pulmonary effort is normal.     Breath sounds: Normal breath sounds.  Abdominal:     Palpations: Abdomen is soft.     Tenderness: There is no abdominal tenderness.  Musculoskeletal:     Cervical back: No tenderness.  Lymphadenopathy:     Cervical: No cervical adenopathy.  Skin:    General: Skin is warm and dry.     Capillary Refill: Capillary refill takes less than 2 seconds.  Neurological:     General: No focal deficit present.     Mental Status: She is alert and oriented to person, place, and time.  Psychiatric:        Mood and Affect: Mood normal.        Behavior: Behavior normal.      ASSESSMENT & PLAN: Problem List Items Addressed This Visit       Other   Dyspepsia   Relevant Medications   pantoprazole (PROTONIX) 40 MG tablet   Other Visit Diagnoses     Routine general medical examination at a health care facility    -  Primary   Relevant Orders   CBC with Differential   Comprehensive metabolic panel   Hemoglobin A1c   Lipid panel   TSH   Screening for deficiency anemia       Relevant Orders   CBC with Differential   Screening for lipoid disorders       Relevant Orders   Lipid panel   Screening for endocrine, metabolic and  immunity disorder       Relevant Orders   Comprehensive metabolic panel   Hemoglobin A1c   TSH  Modifiable risk factors discussed with patient. Anticipatory guidance according to age provided. The following topics were also discussed: Social Determinants of Health Smoking.  Non-smoker Diet and nutrition and need to decrease amount of daily carbohydrate intake and daily calories and increase amount of plant based protein in her diet Benefits of exercise Cancer screening and review of most recent reports of mammogram in 2023 and colonoscopy in 2021 Vaccinations reviewed and recommendations Cardiovascular risk assessment and need for blood work The 10-year ASCVD risk score (Arnett DK, et al., 2019) is: 1.2%   Values used to calculate the score:     Age: 51 years     Sex: Female     Is Non-Hispanic African American: No     Diabetic: No     Tobacco smoker: No     Systolic Blood Pressure: 123456 mmHg     Is BP treated: No     HDL Cholesterol: 54.4 mg/dL     Total Cholesterol: 153 mg/dL Review of medications Mental health including depression and anxiety Fall and accident prevention  Patient Instructions  Health Maintenance, Female Adopting a healthy lifestyle and getting preventive care are important in promoting health and wellness. Ask your health care provider about: The right schedule for you to have regular tests and exams. Things you can do on your own to prevent diseases and keep yourself healthy. What should I know about diet, weight, and exercise? Eat a healthy diet  Eat a diet that includes plenty of vegetables, fruits, low-fat dairy products, and lean protein. Do not eat a lot of foods that are high in solid fats, added sugars, or sodium. Maintain a healthy weight Body mass index (BMI) is used to identify weight problems. It estimates body fat based on height and weight. Your health care provider can help determine your BMI and help you achieve or maintain a healthy  weight. Get regular exercise Get regular exercise. This is one of the most important things you can do for your health. Most adults should: Exercise for at least 150 minutes each week. The exercise should increase your heart rate and make you sweat (moderate-intensity exercise). Do strengthening exercises at least twice a week. This is in addition to the moderate-intensity exercise. Spend less time sitting. Even light physical activity can be beneficial. Watch cholesterol and blood lipids Have your blood tested for lipids and cholesterol at 55 years of age, then have this test every 5 years. Have your cholesterol levels checked more often if: Your lipid or cholesterol levels are high. You are older than 55 years of age. You are at high risk for heart disease. What should I know about cancer screening? Depending on your health history and family history, you may need to have cancer screening at various ages. This may include screening for: Breast cancer. Cervical cancer. Colorectal cancer. Skin cancer. Lung cancer. What should I know about heart disease, diabetes, and high blood pressure? Blood pressure and heart disease High blood pressure causes heart disease and increases the risk of stroke. This is more likely to develop in people who have high blood pressure readings or are overweight. Have your blood pressure checked: Every 3-5 years if you are 51-1 years of age. Every year if you are 8 years old or older. Diabetes Have regular diabetes screenings. This checks your fasting blood sugar level. Have the screening done: Once every three years after age 19 if you are at a normal weight and have a low risk for diabetes. More often  and at a younger age if you are overweight or have a high risk for diabetes. What should I know about preventing infection? Hepatitis B If you have a higher risk for hepatitis B, you should be screened for this virus. Talk with your health care provider to  find out if you are at risk for hepatitis B infection. Hepatitis C Testing is recommended for: Everyone born from [birth year] through 1963. Anyone with known risk factors for hepatitis C. Sexually transmitted infections (STIs) Get screened for STIs, including gonorrhea and chlamydia, if: You are sexually active and are younger than 55 years of age. You are older than 55 years of age and your health care provider tells you that you are at risk for this type of infection. Your sexual activity has changed since you were last screened, and you are at increased risk for chlamydia or gonorrhea. Ask your health care provider if you are at risk. Ask your health care provider about whether you are at high risk for HIV. Your health care provider may recommend a prescription medicine to help prevent HIV infection. If you choose to take medicine to prevent HIV, you should first get tested for HIV. You should then be tested every 3 months for as long as you are taking the medicine. Pregnancy If you are about to stop having your period (premenopausal) and you may become pregnant, seek counseling before you get pregnant. Take 400 to 800 micrograms (mcg) of folic acid every day if you become pregnant. Ask for birth control (contraception) if you want to prevent pregnancy. Osteoporosis and menopause Osteoporosis is a disease in which the bones lose minerals and strength with aging. This can result in bone fractures. If you are 66 years old or older, or if you are at risk for osteoporosis and fractures, ask your health care provider if you should: Be screened for bone loss. Take a calcium or vitamin D supplement to lower your risk of fractures. Be given hormone replacement therapy (HRT) to treat symptoms of menopause. Follow these instructions at home: Alcohol use Do not drink alcohol if: Your health care provider tells you not to drink. You are pregnant, may be pregnant, or are planning to become pregnant. If you  drink alcohol: Limit how much you have to: 0-1 drink a day. Know how much alcohol is in your drink. In the [place] one drink equals one 12 oz bottle of beer (355 mL), one 5 oz glass of wine (148 mL), or one 1 oz glass of hard liquor (44 mL). Lifestyle Do not use any products that contain nicotine or tobacco. These products include cigarettes, chewing tobacco, and vaping devices, such as e-cigarettes. If you need help quitting, ask your health care provider. Do not use street drugs. Do not share needles. Ask your health care provider for help if you need support or information about quitting drugs. General instructions Schedule regular health, dental, and eye exams. Stay current with your vaccines. Tell your health care provider if: You often feel depressed. You have ever been abused or do not feel safe at home. Summary Adopting a healthy lifestyle and getting preventive care are important in promoting health and wellness. Follow your health care provider's instructions about healthy diet, exercising, and getting tested or screened for diseases. Follow your health care provider's instructions on monitoring your cholesterol and blood pressure. This information is not intended to replace advice given to you by your health care provider. Make sure you discuss any questions you have with  your health care provider. Document Revised: 06/23/2020 Document Reviewed: 06/23/2020 Elsevier Patient Education  [place], MD [facility]

## 2022-05-18 NOTE — Patient Instructions (Signed)

## 2022-05-19 LAB — COMPREHENSIVE METABOLIC PANEL
ALT: 44 U/L — ABNORMAL HIGH (ref 0–35)
AST: 31 U/L (ref 0–37)
Albumin: 4.3 g/dL (ref 3.5–5.2)
Alkaline Phosphatase: 55 U/L (ref 39–117)
BUN: 12 mg/dL (ref 6–23)
CO2: 29 mEq/L (ref 19–32)
Calcium: 9.1 mg/dL (ref 8.4–10.5)
Chloride: 105 mEq/L (ref 96–112)
Creatinine, Ser: 0.54 mg/dL (ref 0.40–1.20)
GFR: 104.38 mL/min (ref >60.00)
Glucose, Bld: 100 mg/dL — ABNORMAL HIGH (ref 70–99)
Potassium: 3.6 mEq/L (ref 3.5–5.1)
Sodium: 140 mEq/L (ref 135–145)
Total Bilirubin: 0.6 mg/dL (ref 0.2–1.2)
Total Protein: 7.2 g/dL (ref 6.0–8.3)

## 2022-05-19 LAB — LIPID PANEL
Cholesterol: 159 mg/dL (ref 0–200)
HDL: 52.5 mg/dL (ref >39.00)
LDL Cholesterol: 82 mg/dL (ref 0–99)
NonHDL: 106.68
Total CHOL/HDL Ratio: 3
Triglycerides: 121 mg/dL (ref 0.0–149.0)
VLDL: 24.2 mg/dL (ref 0.0–40.0)

## 2022-06-16 DIAGNOSIS — Z1231 Encounter for screening mammogram for malignant neoplasm of breast: Secondary | ICD-10-CM

## 2022-06-18 ENCOUNTER — Encounter: Payer: Self-pay | Admitting: Gastroenterology

## 2022-08-09 ENCOUNTER — Other Ambulatory Visit: Payer: Self-pay | Admitting: Emergency Medicine

## 2022-08-09 DIAGNOSIS — Z1231 Encounter for screening mammogram for malignant neoplasm of breast: Secondary | ICD-10-CM

## 2022-08-11 ENCOUNTER — Ambulatory Visit
Admission: RE | Admit: 2022-08-11 | Discharge: 2022-08-11 | Disposition: A | Discharge status: Home / Self Care | Payer: 59 | Source: Ambulatory Visit

## 2022-08-11 DIAGNOSIS — Z1231 Encounter for screening mammogram for malignant neoplasm of breast: Secondary | ICD-10-CM | POA: Diagnosis not present

## 2022-08-17 ENCOUNTER — Telehealth: Payer: Self-pay | Admitting: Emergency Medicine

## 2022-08-17 ENCOUNTER — Other Ambulatory Visit: Payer: Self-pay | Admitting: Emergency Medicine

## 2022-08-17 DIAGNOSIS — R928 Other abnormal and inconclusive findings on diagnostic imaging of breast: Secondary | ICD-10-CM

## 2022-08-17 NOTE — Telephone Encounter (Signed)
Patient returned Don's call about her mammogram results. She would like a call back at [phone].

## 2022-08-27 ENCOUNTER — Ambulatory Visit
Admission: RE | Admit: 2022-08-27 | Discharge: 2022-08-27 | Disposition: A | Discharge status: Home / Self Care | Payer: Medicaid Other | Source: Ambulatory Visit | Attending: Emergency Medicine

## 2022-08-27 ENCOUNTER — Ambulatory Visit
Admission: RE | Admit: 2022-08-27 | Discharge: 2022-08-27 | Disposition: A | Discharge status: Home / Self Care | Payer: 59 | Source: Ambulatory Visit | Attending: Emergency Medicine | Admitting: Emergency Medicine

## 2022-08-27 DIAGNOSIS — R928 Other abnormal and inconclusive findings on diagnostic imaging of breast: Secondary | ICD-10-CM

## 2023-05-23 ENCOUNTER — Encounter: Payer: Self-pay | Admitting: Emergency Medicine

## 2023-05-23 ENCOUNTER — Ambulatory Visit (INDEPENDENT_AMBULATORY_CARE_PROVIDER_SITE_OTHER): Admitting: Emergency Medicine

## 2023-05-23 VITALS — BP 140/98 | HR 67 | Temp 98.3°F | Ht 61.0 in | Wt 156.0 lb

## 2023-05-23 DIAGNOSIS — Z1329 Encounter for screening for other suspected endocrine disorder: Secondary | ICD-10-CM

## 2023-05-23 DIAGNOSIS — Z13228 Encounter for screening for other metabolic disorders: Secondary | ICD-10-CM

## 2023-05-23 DIAGNOSIS — Z13220 Encounter for screening for lipoid disorders: Secondary | ICD-10-CM | POA: Diagnosis not present

## 2023-05-23 DIAGNOSIS — Z130 Encounter for screening for diseases of the blood and blood-forming organs and certain disorders involving the immune mechanism: Secondary | ICD-10-CM | POA: Diagnosis not present

## 2023-05-23 DIAGNOSIS — Z0000 Encounter for general adult medical examination without abnormal findings: Secondary | ICD-10-CM

## 2023-05-23 DIAGNOSIS — R030 Elevated blood-pressure reading, without diagnosis of hypertension: Secondary | ICD-10-CM | POA: Insufficient documentation

## 2023-05-23 DIAGNOSIS — Z0001 Encounter for general adult medical examination with abnormal findings: Secondary | ICD-10-CM

## 2023-05-23 LAB — CBC WITH DIFFERENTIAL/PLATELET
Basophils Absolute: 0 10*3/uL (ref 0.0–0.1)
Basophils Relative: 0.6 % (ref 0.0–3.0)
Eosinophils Absolute: 0.1 10*3/uL (ref 0.0–0.7)
Eosinophils Relative: 2.2 % (ref 0.0–5.0)
HCT: 41.3 % (ref 36.0–46.0)
Hemoglobin: 13.7 g/dL (ref 12.0–15.0)
Lymphocytes Relative: 39.4 % (ref 12.0–46.0)
Lymphs Abs: 2.5 10*3/uL (ref 0.7–4.0)
MCHC: 33.1 g/dL (ref 30.0–36.0)
MCV: 94.6 fl (ref 78.0–100.0)
Monocytes Absolute: 0.4 10*3/uL (ref 0.1–1.0)
Monocytes Relative: 6.9 % (ref 3.0–12.0)
Neutro Abs: 3.2 10*3/uL (ref 1.4–7.7)
Neutrophils Relative %: 50.9 % (ref 43.0–77.0)
Platelets: 250 10*3/uL (ref 150.0–400.0)
RBC: 4.37 Mil/uL (ref 3.87–5.11)
RDW: 13.9 % (ref 11.5–15.5)
WBC: 6.3 10*3/uL (ref 4.0–10.5)

## 2023-05-23 LAB — COMPREHENSIVE METABOLIC PANEL WITH GFR
ALT: 35 U/L (ref 0–35)
AST: 27 U/L (ref 0–37)
Albumin: 4.7 g/dL (ref 3.5–5.2)
Alkaline Phosphatase: 58 U/L (ref 39–117)
BUN: 11 mg/dL (ref 6–23)
CO2: 28 meq/L (ref 19–32)
Calcium: 9.3 mg/dL (ref 8.4–10.5)
Chloride: 105 meq/L (ref 96–112)
Creatinine, Ser: 0.47 mg/dL (ref 0.40–1.20)
GFR: 107.17 mL/min (ref >60.00)
Glucose, Bld: 87 mg/dL (ref 70–99)
Potassium: 3.7 meq/L (ref 3.5–5.1)
Sodium: 141 meq/L (ref 135–145)
Total Bilirubin: 0.8 mg/dL (ref 0.2–1.2)
Total Protein: 7.7 g/dL (ref 6.0–8.3)

## 2023-05-23 LAB — URINALYSIS
Bilirubin Urine: NEGATIVE
Ketones, ur: NEGATIVE
Leukocytes,Ua: NEGATIVE
Nitrite: NEGATIVE
Specific Gravity, Urine: <=1.005 — AB (ref 1.000–1.030)
Total Protein, Urine: NEGATIVE
Urine Glucose: NEGATIVE
Urobilinogen, UA: 0.2 (ref 0.0–1.0)
pH: 6 (ref 5.0–8.0)

## 2023-05-23 LAB — LIPID PANEL
Cholesterol: 176 mg/dL (ref 0–200)
HDL: 56 mg/dL (ref >39.00)
LDL Cholesterol: 99 mg/dL (ref 0–99)
NonHDL: 119.97
Total CHOL/HDL Ratio: 3
Triglycerides: 106 mg/dL (ref 0.0–149.0)
VLDL: 21.2 mg/dL (ref 0.0–40.0)

## 2023-05-23 LAB — HEMOGLOBIN A1C: Hgb A1c MFr Bld: 6 % (ref 4.6–6.5)

## 2023-05-23 LAB — VITAMIN B12: Vitamin B-12: 395 pg/mL (ref 211–911)

## 2023-05-23 LAB — VITAMIN D 25 HYDROXY (VIT D DEFICIENCY, FRACTURES): VITD: 24.86 ng/mL — ABNORMAL LOW (ref 30.00–100.00)

## 2023-05-23 LAB — TSH: TSH: 1.82 u[IU]/mL (ref 0.35–5.50)

## 2023-05-23 NOTE — Assessment & Plan Note (Signed)
 No history of hypertension Elevated reading in the office today but normal readings at home Cardiovascular risks associated with hypertension discussed Diet and nutrition discussed Benefits of exercise discussed Advised to monitor blood pressure readings at home daily for the next several weeks and keep a log.  Advised to contact the office if numbers persistently abnormal

## 2023-05-23 NOTE — Progress Notes (Signed)
 Emily Lara 58 y.o.   Chief Complaint  Patient presents with   Annual Exam    Patient her for physical. No other concerns     HISTORY OF PRESENT ILLNESS: This is a 58 y.o. female here for her annual exam. Has no complaints or any other medical concerns.  HPI   Prior to Admission medications   Medication Sig Start Date End Date Taking? Authorizing Provider  Ascorbic Acid (VITAMIN C) 1000 MG tablet Take 1,000 mg by mouth daily. Patient not taking: Reported on 05/23/2023    [provider]  ibuprofen (ADVIL) 800 MG tablet Take 1 tablet (800 mg total) by mouth every 8 (eight) hours as needed for moderate pain. Patient not taking: Reported on 05/23/2023 11/20/21   Gilda Crease, MD  meloxicam (MOBIC) 15 MG tablet Take 1 tablet (15 mg total) by mouth daily. Patient not taking: Reported on 05/23/2023 01/08/21   Edwin Cap, DPM  methocarbamol (ROBAXIN) 500 MG tablet Take 1 tablet (500 mg total) by mouth every 8 (eight) hours as needed for muscle spasms. Patient not taking: Reported on 05/23/2023 11/20/21   Gilda Crease, MD  Multiple Vitamin (MULTIVITAMIN) tablet Take 1 tablet by mouth daily. Patient not taking: Reported on 05/23/2023    [provider]  pantoprazole (PROTONIX) 40 MG tablet Take 1 tablet (40 mg total) by mouth daily. Patient not taking: Reported on 05/23/2023 05/18/22   Georgina Quint, MD  RESTASIS 0.05 % ophthalmic emulsion 1 drop 2 (two) times daily. Patient not taking: Reported on 05/23/2023 10/21/20   [provider]  VITAMIN D PO Take 2,000 Units by mouth daily. Patient not taking: Reported on 05/23/2023    [provider]    No Known Allergies  Patient Active Problem List   Diagnosis Date Noted   Chronic meniscal tear of knee 10/07/2021   Loose body in knee, right knee 10/07/2021   Unilateral primary osteoarthritis, right knee 08/26/2021   Vitamin D insufficiency 07/26/2015    Past Medical History:   Diagnosis Date   Allergy    GERD (gastroesophageal reflux disease)    Pre-diabetes    diet controlled    Vitamin D deficiency     Past Surgical History:  Procedure Laterality Date   CESAREAN SECTION  2007    Social History   Socioeconomic History   Marital status: Married    Spouse name: Not on file   Number of children: Not on file   Years of education: Not on file   Highest education level: Associate degree: academic program  Occupational History   Not on file  Tobacco Use   Smoking status: Never   Smokeless tobacco: Never  Vaping Use   Vaping status: Never Used  Substance and Sexual Activity   Alcohol use: No    Alcohol/week: 0.0 standard drinks of alcohol   Drug use: No   Sexual activity: Yes    Partners: Male    Birth control/protection: Post-menopausal    Comment: 1st intercourse 53 yo-1 partner  Other Topics Concern   Not on file  Social History Narrative   Not on file   Social Drivers of Health   Financial Resource Strain: Medium Risk (05/23/2023)   Overall Financial Resource Strain (CARDIA)    Difficulty of Paying Living Expenses: Somewhat hard  Food Insecurity: Food Insecurity Present (05/23/2023)   Hunger Vital Sign    Worried About Running Out of Food in the Last Year: Often true  Ran Out of Food in the Last Year: Often true  Transportation Needs: No Transportation Needs (05/23/2023)   PRAPARE - Administrator, Civil Service (Medical): No    Lack of Transportation (Non-Medical): No  Physical Activity: Unknown (05/23/2023)   Exercise Vital Sign    Days of Exercise per Week: 0 days    Minutes of Exercise per Session: Not on file  Stress: No Stress Concern Present (05/23/2023)   [employer] of Occupational Health - Occupational Stress Questionnaire    Feeling of Stress : Not at all  Social Connections: Socially Integrated (05/23/2023)   Social Connection and Isolation Panel [NHANES]    Frequency of Communication with Friends and Family:  More than three times a week    Frequency of Social Gatherings with Friends and Family: More than three times a week    Attends Religious Services: More than 4 times per year    Active Member of [employer] or Organizations: Yes    Attends Engineer, structural: More than 4 times per year    Marital Status: Married  Catering manager Violence: Not on file    Family History  Problem Relation Age of Onset   Hyperlipidemia Father    Thyroid disease Father    Alcohol abuse Neg Hx    Cancer Neg Hx    COPD Neg Hx    Depression Neg Hx    Diabetes Neg Hx    Drug abuse Neg Hx    Early death Neg Hx    Hearing loss Neg Hx    Heart disease Neg Hx    Hypertension Neg Hx    Kidney disease Neg Hx    Stroke Neg Hx    Colon cancer Neg Hx    Colon polyps Neg Hx    Esophageal cancer Neg Hx    Rectal cancer Neg Hx    Stomach cancer Neg Hx      Review of Systems  Constitutional: Negative.  Negative for chills and fever.  HENT: Negative.  Negative for congestion and sore throat.   Respiratory: Negative.  Negative for cough and shortness of breath.   Cardiovascular: Negative.  Negative for chest pain and palpitations.  Gastrointestinal:  Negative for abdominal pain, diarrhea, nausea and vomiting.  Genitourinary: Negative.  Negative for dysuria and hematuria.  Skin: Negative.   Neurological: Negative.  Negative for dizziness and headaches.  All other systems reviewed and are negative.   Today's Vitals   05/23/23 1301  BP: (!) 140/98  Pulse: 67  Temp: 98.3 F (36.8 C)  TempSrc: Oral  SpO2: 97%  Weight: 156 lb (70.8 kg)  Height: 5\' 1"  (1.549 m)   Body mass index is 29.48 kg/m.   Physical Exam Vitals reviewed.  Constitutional:      Appearance: Normal appearance.  HENT:     Head: Normocephalic.     Right Ear: Tympanic membrane, ear canal and external ear normal.     Left Ear: Tympanic membrane, ear canal and external ear normal.     Mouth/Throat:     Mouth: Mucous membranes  are moist.     Pharynx: Oropharynx is clear.  Eyes:     Extraocular Movements: Extraocular movements intact.     Conjunctiva/sclera: Conjunctivae normal.     Pupils: Pupils are equal, round, and reactive to light.  Cardiovascular:     Rate and Rhythm: Normal rate and regular rhythm.     Pulses: Normal pulses.     Heart sounds: Normal  heart sounds.  Pulmonary:     Effort: Pulmonary effort is normal.  Abdominal:     Palpations: Abdomen is soft.     Tenderness: There is no abdominal tenderness.  Musculoskeletal:     Cervical back: No tenderness.     Right lower leg: No edema.     Left lower leg: No edema.  Lymphadenopathy:     Cervical: No cervical adenopathy.  Skin:    General: Skin is warm and dry.  Neurological:     Mental Status: She is alert and oriented to person, place, and time.  Psychiatric:        Mood and Affect: Mood normal.        Behavior: Behavior normal.      ASSESSMENT & PLAN: Problem List Items Addressed This Visit       Other   Elevated blood pressure reading   No history of hypertension Elevated reading in the office today but normal readings at home Cardiovascular risks associated with hypertension discussed Diet and nutrition discussed Benefits of exercise discussed Advised to monitor blood pressure readings at home daily for the next several weeks and keep a log.  Advised to contact the office if numbers persistently abnormal      Relevant Orders   Comprehensive metabolic panel with GFR   CBC with Differential/Platelet   Other Visit Diagnoses       Encounter for general adult medical examination with abnormal findings    -  Primary   Relevant Orders   Vitamin B12   VITAMIN D 25 Hydroxy (Vit-D Deficiency, Fractures)   TSH   Hemoglobin A1c   Comprehensive metabolic panel with GFR   CBC with Differential/Platelet   Lipid panel     Screening for deficiency anemia       Relevant Orders   CBC with Differential/Platelet     Screening for  lipoid disorders       Relevant Orders   Lipid panel     Screening for endocrine, metabolic and immunity disorder       Relevant Orders   Vitamin B12   VITAMIN D 25 Hydroxy (Vit-D Deficiency, Fractures)   TSH   Hemoglobin A1c   Comprehensive metabolic panel with GFR      Modifiable risk factors discussed with patient. Anticipatory guidance according to age provided. The following topics were also discussed: Social Determinants of Health Smoking Diet and nutrition Benefits of exercise Cancer screening and review of most recent mammogram and colonoscopy reports Vaccinations review and recommendations Cardiovascular risk assessment The 10-year ASCVD risk score (Arnett DK, et al., 2019) is: 1.9%   Values used to calculate the score:     Age: 83 years     Sex: Female     Is Non-Hispanic African American: No     Diabetic: No     Tobacco smoker: No     Systolic Blood Pressure: 140 mmHg     Is BP treated: No     HDL Cholesterol: 52.5 mg/dL     Total Cholesterol: 159 mg/dL  Mental health including depression and anxiety Fall and accident prevention  Patient Instructions  Health Maintenance, Female Adopting a healthy lifestyle and getting preventive care are important in promoting health and wellness. Ask your health care provider about: The right schedule for you to have regular tests and exams. Things you can do on your own to prevent diseases and keep yourself healthy. What should I know about diet, weight, and exercise? Eat a healthy diet  Eat a diet that includes plenty of vegetables, fruits, low-fat dairy products, and lean protein. Do not eat a lot of foods that are high in solid fats, added sugars, or sodium. Maintain a healthy weight Body mass index (BMI) is used to identify weight problems. It estimates body fat based on height and weight. Your health care provider can help determine your BMI and help you achieve or maintain a healthy weight. Get regular  exercise Get regular exercise. This is one of the most important things you can do for your health. Most adults should: Exercise for at least 150 minutes each week. The exercise should increase your heart rate and make you sweat (moderate-intensity exercise). Do strengthening exercises at least twice a week. This is in addition to the moderate-intensity exercise. Spend less time sitting. Even light physical activity can be beneficial. Watch cholesterol and blood lipids Have your blood tested for lipids and cholesterol at 56 years of age, then have this test every 5 years. Have your cholesterol levels checked more often if: Your lipid or cholesterol levels are high. You are older than 56 years of age. You are at high risk for heart disease. What should I know about cancer screening? Depending on your health history and family history, you may need to have cancer screening at various ages. This may include screening for: Breast cancer. Cervical cancer. Colorectal cancer. Skin cancer. Lung cancer. What should I know about heart disease, diabetes, and high blood pressure? Blood pressure and heart disease High blood pressure causes heart disease and increases the risk of stroke. This is more likely to develop in people who have high blood pressure readings or are overweight. Have your blood pressure checked: Every 3-5 years if you are 53-50 years of age. Every year if you are 36 years old or older. Diabetes Have regular diabetes screenings. This checks your fasting blood sugar level. Have the screening done: Once every three years after age 34 if you are at a normal weight and have a low risk for diabetes. More often and at a younger age if you are overweight or have a high risk for diabetes. What should I know about preventing infection? Hepatitis B If you have a higher risk for hepatitis B, you should be screened for this virus. Talk with your health care provider to find out if you are at  risk for hepatitis B infection. Hepatitis C Testing is recommended for: Everyone born from [birth year] through 1963. Anyone with known risk factors for hepatitis C. Sexually transmitted infections (STIs) Get screened for STIs, including gonorrhea and chlamydia, if: You are sexually active and are younger than 56 years of age. You are older than 56 years of age and your health care provider tells you that you are at risk for this type of infection. Your sexual activity has changed since you were last screened, and you are at increased risk for chlamydia or gonorrhea. Ask your health care provider if you are at risk. Ask your health care provider about whether you are at high risk for HIV. Your health care provider may recommend a prescription medicine to help prevent HIV infection. If you choose to take medicine to prevent HIV, you should first get tested for HIV. You should then be tested every 3 months for as long as you are taking the medicine. Pregnancy If you are about to stop having your period (premenopausal) and you may become pregnant, seek counseling before you get pregnant. Take 400 to 800 micrograms (mcg)  of folic acid every day if you become pregnant. Ask for birth control (contraception) if you want to prevent pregnancy. Osteoporosis and menopause Osteoporosis is a disease in which the bones lose minerals and strength with aging. This can result in bone fractures. If you are 35 years old or older, or if you are at risk for osteoporosis and fractures, ask your health care provider if you should: Be screened for bone loss. Take a calcium or vitamin D supplement to lower your risk of fractures. Be given hormone replacement therapy (HRT) to treat symptoms of menopause. Follow these instructions at home: Alcohol use Do not drink alcohol if: Your health care provider tells you not to drink. You are pregnant, may be pregnant, or are planning to become pregnant. If you drink alcohol: Limit  how much you have to: 0-1 drink a day. Know how much alcohol is in your drink. In the [place] one drink equals one 12 oz bottle of beer (355 mL), one 5 oz glass of wine (148 mL), or one 1 oz glass of hard liquor (44 mL). Lifestyle Do not use any products that contain nicotine or tobacco. These products include cigarettes, chewing tobacco, and vaping devices, such as e-cigarettes. If you need help quitting, ask your health care provider. Do not use street drugs. Do not share needles. Ask your health care provider for help if you need support or information about quitting drugs. General instructions Schedule regular health, dental, and eye exams. Stay current with your vaccines. Tell your health care provider if: You often feel depressed. You have ever been abused or do not feel safe at home. Summary Adopting a healthy lifestyle and getting preventive care are important in promoting health and wellness. Follow your health care provider's instructions about healthy diet, exercising, and getting tested or screened for diseases. Follow your health care provider's instructions on monitoring your cholesterol and blood pressure. This information is not intended to replace advice given to you by your health care provider. Make sure you discuss any questions you have with your health care provider. Document Revised: 06/23/2020 Document Reviewed: 06/23/2020 Elsevier Patient Education  2024 Elsevier Inc.     Edwina Barth, MD [facility]

## 2023-05-23 NOTE — Patient Instructions (Signed)

## 2023-05-23 NOTE — Addendum Note (Signed)
 Addended by: Evie Lacks on: 05/23/2023 01:57 PM   Modules accepted: Orders

## 2023-07-18 ENCOUNTER — Other Ambulatory Visit: Payer: Self-pay | Admitting: Emergency Medicine

## 2023-07-18 DIAGNOSIS — Z0000 Encounter for general adult medical examination without abnormal findings: Secondary | ICD-10-CM

## 2023-08-01 ENCOUNTER — Encounter

## 2023-08-15 ENCOUNTER — Encounter

## 2023-10-03 ENCOUNTER — Other Ambulatory Visit: Payer: Self-pay | Admitting: Emergency Medicine

## 2023-10-03 DIAGNOSIS — Z1231 Encounter for screening mammogram for malignant neoplasm of breast: Secondary | ICD-10-CM

## 2023-10-14 ENCOUNTER — Ambulatory Visit
Admission: RE | Admit: 2023-10-14 | Discharge: 2023-10-14 | Disposition: A | Discharge status: Home / Self Care | Source: Ambulatory Visit

## 2023-10-14 DIAGNOSIS — Z1231 Encounter for screening mammogram for malignant neoplasm of breast: Secondary | ICD-10-CM | POA: Diagnosis not present

## 2023-10-20 ENCOUNTER — Ambulatory Visit: Payer: Self-pay | Admitting: Emergency Medicine

## 2023-10-27 ENCOUNTER — Encounter: Payer: Self-pay | Admitting: Emergency Medicine

## 2023-10-27 ENCOUNTER — Ambulatory Visit: Admitting: Emergency Medicine

## 2023-10-27 VITALS — BP 128/84 | HR 85 | Temp 98.5°F | Ht 61.0 in | Wt 157.0 lb

## 2023-10-27 DIAGNOSIS — I10 Essential (primary) hypertension: Secondary | ICD-10-CM | POA: Diagnosis not present

## 2023-10-27 LAB — CBC WITH DIFFERENTIAL/PLATELET
Basophils Absolute: 0 K/uL (ref 0.0–0.1)
Basophils Relative: 0.4 % (ref 0.0–3.0)
Eosinophils Absolute: 0.1 K/uL (ref 0.0–0.7)
Eosinophils Relative: 2 % (ref 0.0–5.0)
HCT: 41.4 % (ref 36.0–46.0)
Hemoglobin: 13.8 g/dL (ref 12.0–15.0)
Lymphocytes Relative: 35.2 % (ref 12.0–46.0)
Lymphs Abs: 2.6 K/uL (ref 0.7–4.0)
MCHC: 33.3 g/dL (ref 30.0–36.0)
MCV: 93.3 fl (ref 78.0–100.0)
Monocytes Absolute: 0.6 K/uL (ref 0.1–1.0)
Monocytes Relative: 8.3 % (ref 3.0–12.0)
Neutro Abs: 3.9 K/uL (ref 1.4–7.7)
Neutrophils Relative %: 54.1 % (ref 43.0–77.0)
Platelets: 252 K/uL (ref 150.0–400.0)
RBC: 4.43 Mil/uL (ref 3.87–5.11)
RDW: 14.2 % (ref 11.5–15.5)
WBC: 7.3 K/uL (ref 4.0–10.5)

## 2023-10-27 LAB — COMPREHENSIVE METABOLIC PANEL WITH GFR
ALT: 84 U/L — ABNORMAL HIGH (ref 0–35)
AST: 52 U/L — ABNORMAL HIGH (ref 0–37)
Albumin: 4.6 g/dL (ref 3.5–5.2)
Alkaline Phosphatase: 57 U/L (ref 39–117)
BUN: 12 mg/dL (ref 6–23)
CO2: 30 meq/L (ref 19–32)
Calcium: 9.9 mg/dL (ref 8.4–10.5)
Chloride: 101 meq/L (ref 96–112)
Creatinine, Ser: 0.53 mg/dL (ref 0.40–1.20)
GFR: 103.8 mL/min (ref >60.00)
Glucose, Bld: 89 mg/dL (ref 70–99)
Potassium: 3.9 meq/L (ref 3.5–5.1)
Sodium: 139 meq/L (ref 135–145)
Total Bilirubin: 0.7 mg/dL (ref 0.2–1.2)
Total Protein: 7.8 g/dL (ref 6.0–8.3)

## 2023-10-27 MED ORDER — LISINOPRIL 10 MG PO TABS: 10.0000 mg | ORAL_TABLET | Freq: Every day | ORAL | 3 refills | Status: AC

## 2023-10-27 NOTE — Assessment & Plan Note (Signed)
 Clinically stable and asymptomatic at present time Normotensive today. Very elevated blood pressure readings at home with symptoms. Normal EKG.  No ischemic changes and no findings of hypertensive heart disease Unremarkable exam.  No findings of volume overload or congestive heart failure. Recommend blood work today. Recommend to monitor blood pressure readings at home twice a day for the next couple weeks Start lisinopril  10 mg daily.  Will increase if needed depending on home blood pressure reading numbers. Will follow-up in 4 weeks

## 2023-10-27 NOTE — Patient Instructions (Addendum)
 Start lisinopril  10 mg daily Continue monitoring blood pressure readings at home twice a day for the next couple of weeks.  Contact the office if numbers persistently abnormal. Follow-up in the office in 4 weeks.  Hypertension, Adult High blood pressure (hypertension) is when the force of blood pumping through the arteries is too strong. The arteries are the blood vessels that carry blood from the heart throughout the body. Hypertension forces the heart to work harder to pump blood and may cause arteries to become narrow or stiff. Untreated or uncontrolled hypertension can lead to a heart attack, heart failure, a stroke, kidney disease, and other problems. A blood pressure reading consists of a higher number over a lower number. Ideally, your blood pressure should be below 120/80. The first (top) number is called the systolic pressure. It is a measure of the pressure in your arteries as your heart beats. The second (bottom) number is called the diastolic pressure. It is a measure of the pressure in your arteries as the heart relaxes. What are the causes? The exact cause of this condition is not known. There are some conditions that result in high blood pressure. What increases the risk? Certain factors may make you more likely to develop high blood pressure. Some of these risk factors are under your control, including: Smoking. Not getting enough exercise or physical activity. Being overweight. Having too much fat, sugar, calories, or salt (sodium) in your diet. Drinking too much alcohol. Other risk factors include: Having a personal history of heart disease, diabetes, high cholesterol, or kidney disease. Stress. Having a family history of high blood pressure and high cholesterol. Having obstructive sleep apnea. Age. The risk increases with age. What are the signs or symptoms? High blood pressure may not cause symptoms. Very high blood pressure (hypertensive crisis) may  cause: Headache. Fast or irregular heartbeats (palpitations). Shortness of breath. Nosebleed. Nausea and vomiting. Vision changes. Severe chest pain, dizziness, and seizures. How is this diagnosed? This condition is diagnosed by measuring your blood pressure while you are seated, with your arm resting on a flat surface, your legs uncrossed, and your feet flat on the floor. The cuff of the blood pressure monitor will be placed directly against the skin of your upper arm at the level of your heart. Blood pressure should be measured at least twice using the same arm. Certain conditions can cause a difference in blood pressure between your right and left arms. If you have a high blood pressure reading during one visit or you have normal blood pressure with other risk factors, you may be asked to: Return on a different day to have your blood pressure checked again. Monitor your blood pressure at home for 1 week or longer. If you are diagnosed with hypertension, you may have other blood or imaging tests to help your health care provider understand your overall risk for other conditions. How is this treated? This condition is treated by making healthy lifestyle changes, such as eating healthy foods, exercising more, and reducing your alcohol intake. You may be referred for counseling on a healthy diet and physical activity. Your health care provider may prescribe medicine if lifestyle changes are not enough to get your blood pressure under control and if: Your systolic blood pressure is above 130. Your diastolic blood pressure is above 80. Your personal target blood pressure may vary depending on your medical conditions, your age, and other factors. Follow these instructions at home: Eating and drinking  Eat a diet that is  high in fiber and potassium, and low in sodium, added sugar, and fat. An example of this eating plan is called the DASH diet. DASH stands for Dietary Approaches to Stop  Hypertension. To eat this way: Eat plenty of fresh fruits and vegetables. Try to fill one half of your plate at each meal with fruits and vegetables. Eat whole grains, such as whole-wheat pasta, brown rice, or whole-grain bread. Fill about one fourth of your plate with whole grains. Eat or drink low-fat dairy products, such as skim milk or low-fat yogurt. Avoid fatty cuts of meat, processed or cured meats, and poultry with skin. Fill about one fourth of your plate with lean proteins, such as fish, chicken without skin, beans, eggs, or tofu. Avoid pre-made and processed foods. These tend to be higher in sodium, added sugar, and fat. Reduce your daily sodium intake. Many people with hypertension should eat less than 1,500 mg of sodium a day. Do not drink alcohol if: Your health care provider tells you not to drink. You are pregnant, may be pregnant, or are planning to become pregnant. If you drink alcohol: Limit how much you have to: 0-1 drink a day for women. 0-2 drinks a day for men. Know how much alcohol is in your drink. In the [place] one drink equals one 12 oz bottle of beer (355 mL), one 5 oz glass of wine (148 mL), or one 1 oz glass of hard liquor (44 mL). Lifestyle  Work with your health care provider to maintain a healthy body weight or to lose weight. Ask what an ideal weight is for you. Get at least 30 minutes of exercise that causes your heart to beat faster (aerobic exercise) most days of the week. Activities may include walking, swimming, or biking. Include exercise to strengthen your muscles (resistance exercise), such as Pilates or lifting weights, as part of your weekly exercise routine. Try to do these types of exercises for 30 minutes at least 3 days a week. Do not use any products that contain nicotine or tobacco. These products include cigarettes, chewing tobacco, and vaping devices, such as e-cigarettes. If you need help quitting, ask your health care provider. Monitor your  blood pressure at home as told by your health care provider. Keep all follow-up visits. This is important. Medicines Take over-the-counter and prescription medicines only as told by your health care provider. Follow directions carefully. Blood pressure medicines must be taken as prescribed. Do not skip doses of blood pressure medicine. Doing this puts you at risk for problems and can make the medicine less effective. Ask your health care provider about side effects or reactions to medicines that you should watch for. Contact a health care provider if you: Think you are having a reaction to a medicine you are taking. Have headaches that keep coming back (recurring). Feel dizzy. Have swelling in your ankles. Have trouble with your vision. Get help right away if you: Develop a severe headache or confusion. Have unusual weakness or numbness. Feel faint. Have severe pain in your chest or abdomen. Vomit repeatedly. Have trouble breathing. These symptoms may be an emergency. Get help right away. Call 911. Do not wait to see if the symptoms will go away. Do not drive yourself to the hospital. Summary Hypertension is when the force of blood pumping through your arteries is too strong. If this condition is not controlled, it may put you at risk for serious complications. Your personal target blood pressure may vary depending on your medical  conditions, your age, and other factors. For most people, a normal blood pressure is less than 120/80. Hypertension is treated with lifestyle changes, medicines, or a combination of both. Lifestyle changes include losing weight, eating a healthy, low-sodium diet, exercising more, and limiting alcohol. This information is not intended to replace advice given to you by your health care provider. Make sure you discuss any questions you have with your health care provider. Document Revised: 12/09/2020 Document Reviewed: 12/09/2020 Elsevier Patient Education  2024  [employer].

## 2023-10-27 NOTE — Progress Notes (Signed)
 Emily Lara 58 y.o.   Chief Complaint  Patient presents with   Hypertension    Patient states her bp reading at home and [facility]  has been in the 140s and the highest being 180/ 119. Patient states she has throbbing head aches. Patient says she takes tylenol  for her head aches and says it does not help    HISTORY OF PRESENT ILLNESS: This is a 58 y.o. female complaining of intermittent headaches and dizziness for the last month Elevated blood pressure readings at home At one point it was 180/113 at home.  At [facility] it was 140/90. BP Readings from Last 3 Encounters:  10/27/23 128/84  05/23/23 (!) 140/98  05/18/22 120/82  No other associated symptoms.  No other complaints or medical concerns today.   Hypertension Associated symptoms include headaches. Pertinent negatives include no chest pain, palpitations or shortness of breath.     Prior to Admission medications   Medication Sig Start Date End Date Taking? Authorizing Provider  VITAMIN D  PO Take 2,000 Units by mouth daily.   Yes [provider]  Ascorbic Acid (VITAMIN C) 1000 MG tablet Take 1,000 mg by mouth daily. Patient not taking: Reported on 10/27/2023    [provider]  ibuprofen  (ADVIL ) 800 MG tablet Take 1 tablet (800 mg total) by mouth every 8 (eight) hours as needed for moderate pain. Patient not taking: Reported on 10/27/2023 11/20/21   Haze Lonni PARAS, MD  meloxicam  (MOBIC ) 15 MG tablet Take 1 tablet (15 mg total) by mouth daily. Patient not taking: Reported on 10/27/2023 01/08/21   Silva Juliene SAUNDERS, DPM  methocarbamol  (ROBAXIN ) 500 MG tablet Take 1 tablet (500 mg total) by mouth every 8 (eight) hours as needed for muscle spasms. Patient not taking: Reported on 10/27/2023 11/20/21   Haze Lonni PARAS, MD  Multiple Vitamin (MULTIVITAMIN) tablet Take 1 tablet by mouth daily. Patient not taking: Reported on 10/27/2023    [provider]  pantoprazole  (PROTONIX ) 40 MG tablet Take 1  tablet (40 mg total) by mouth daily. Patient not taking: Reported on 10/27/2023 05/18/22   Sami Froh Jose, MD  RESTASIS 0.05 % ophthalmic emulsion 1 drop 2 (two) times daily. Patient not taking: Reported on 10/27/2023 10/21/20   [provider]    No Known Allergies  Patient Active Problem List   Diagnosis Date Noted   Elevated blood pressure reading 05/23/2023   Chronic meniscal tear of knee 10/07/2021   Loose body in knee, right knee 10/07/2021   Unilateral primary osteoarthritis, right knee 08/26/2021   Vitamin D  insufficiency 07/26/2015    Past Medical History:  Diagnosis Date   Allergy    GERD (gastroesophageal reflux disease)    Pre-diabetes    diet controlled    Vitamin D  deficiency     Past Surgical History:  Procedure Laterality Date   CESAREAN SECTION  2007    Social History   Socioeconomic History   Marital status: Married    Spouse name: Not on file   Number of children: Not on file   Years of education: Not on file   Highest education level: Associate degree: academic program  Occupational History   Not on file  Tobacco Use   Smoking status: Never   Smokeless tobacco: Never  Vaping Use   Vaping status: Never Used  Substance and Sexual Activity   Alcohol use: No    Alcohol/week: 0.0 standard drinks of alcohol   Drug use: No   Sexual activity: Yes  Partners: Male    Birth control/protection: Post-menopausal    Comment: 1st intercourse 37 yo-1 partner  Other Topics Concern   Not on file  Social History Narrative   Not on file   Social Drivers of Health   Financial Resource Strain: Low Risk  (10/26/2023)   Overall Financial Resource Strain (CARDIA)    Difficulty of Paying Living Expenses: Not very hard  Food Insecurity: Food Insecurity Present (10/26/2023)   Hunger Vital Sign    Worried About Running Out of Food in the Last Year: Sometimes true    Ran Out of Food in the Last Year: Sometimes true  Transportation Needs: No  Transportation Needs (10/26/2023)   PRAPARE - Administrator, Civil Service (Medical): No    Lack of Transportation (Non-Medical): No  Physical Activity: Inactive (10/26/2023)   Exercise Vital Sign    Days of Exercise per Week: 0 days    Minutes of Exercise per Session: Not on file  Stress: No Stress Concern Present (10/26/2023)   [employer] of Occupational Health - Occupational Stress Questionnaire    Feeling of Stress: Not at all  Social Connections: Socially Integrated (10/26/2023)   Social Connection and Isolation Panel    Frequency of Communication with Friends and Family: More than three times a week    Frequency of Social Gatherings with Friends and Family: More than three times a week    Attends Religious Services: More than 4 times per year    Active Member of [employer] or Organizations: Yes    Attends Engineer, structural: More than 4 times per year    Marital Status: Married  Catering manager Violence: Not on file    Family History  Problem Relation Age of Onset   Breast cancer Mother    Hyperlipidemia Father    Thyroid  disease Father    Alcohol abuse Neg Hx    Cancer Neg Hx    COPD Neg Hx    Depression Neg Hx    Diabetes Neg Hx    Drug abuse Neg Hx    Early death Neg Hx    Hearing loss Neg Hx    Heart disease Neg Hx    Hypertension Neg Hx    Kidney disease Neg Hx    Stroke Neg Hx    Colon cancer Neg Hx    Colon polyps Neg Hx    Esophageal cancer Neg Hx    Rectal cancer Neg Hx    Stomach cancer Neg Hx      Review of Systems  Constitutional: Negative.  Negative for chills and fever.  HENT: Negative.  Negative for congestion and sore throat.   Respiratory: Negative.  Negative for cough and shortness of breath.   Cardiovascular: Negative.  Negative for chest pain and palpitations.  Gastrointestinal:  Negative for abdominal pain, constipation, diarrhea, nausea and vomiting.  Genitourinary: Negative.  Negative for dysuria.  Skin:  Negative.  Negative for rash.  Neurological:  Positive for dizziness and headaches.  All other systems reviewed and are negative.   Vitals:   10/27/23 1413  BP: 128/84  Pulse: 85  Temp: 98.5 F (36.9 C)  SpO2: 96%    Physical Exam Vitals reviewed.  Constitutional:      Appearance: Normal appearance.  HENT:     Head: Normocephalic.     Mouth/Throat:     Mouth: Mucous membranes are moist.     Pharynx: Oropharynx is clear.  Eyes:     Extraocular Movements:  Extraocular movements intact.     Pupils: Pupils are equal, round, and reactive to light.  Cardiovascular:     Rate and Rhythm: Normal rate and regular rhythm.     Pulses: Normal pulses.     Heart sounds: Normal heart sounds.  Pulmonary:     Effort: Pulmonary effort is normal.     Breath sounds: Normal breath sounds.  Abdominal:     Palpations: Abdomen is soft.     Tenderness: There is no abdominal tenderness.  Musculoskeletal:     Cervical back: No tenderness.     Right lower leg: No edema.     Left lower leg: No edema.  Lymphadenopathy:     Cervical: No cervical adenopathy.  Skin:    General: Skin is warm and dry.  Neurological:     General: No focal deficit present.     Mental Status: She is alert and oriented to person, place, and time.  Psychiatric:        Mood and Affect: Mood normal.        Behavior: Behavior normal.    EKG: Normal sinus rhythm with ventricular rate 73/min.  No acute ischemic changes.  No findings of hypertensive heart disease.  ASSESSMENT & PLAN: A total of 42 minutes was spent with the patient and counseling/coordination of care regarding preparing for this visit, review of most recent office visit notes, diagnosis of hypertension and cardiovascular risks associated with this condition, need for hypertension treatment with lisinopril , review of all medications, review of most recent bloodwork results, review of health maintenance items, education on nutrition, prognosis, documentation,  and need for follow up.   Problem List Items Addressed This Visit       Cardiovascular and Mediastinum   Primary hypertension - Primary   Clinically stable and asymptomatic at present time Normotensive today. Very elevated blood pressure readings at home with symptoms. Normal EKG.  No ischemic changes and no findings of hypertensive heart disease Unremarkable exam.  No findings of volume overload or congestive heart failure. Recommend blood work today. Recommend to monitor blood pressure readings at home twice a day for the next couple weeks Start lisinopril  10 mg daily.  Will increase if needed depending on home blood pressure reading numbers. Will follow-up in 4 weeks      Relevant Medications   lisinopril  (ZESTRIL ) 10 MG tablet   Other Relevant Orders   Comprehensive metabolic panel with GFR   CBC with Differential/Platelet   Patient Instructions  Start lisinopril  10 mg daily Continue monitoring blood pressure readings at home twice a day for the next couple of weeks.  Contact the office if numbers persistently abnormal. Follow-up in the office in 4 weeks.  Hypertension, Adult High blood pressure (hypertension) is when the force of blood pumping through the arteries is too strong. The arteries are the blood vessels that carry blood from the heart throughout the body. Hypertension forces the heart to work harder to pump blood and may cause arteries to become narrow or stiff. Untreated or uncontrolled hypertension can lead to a heart attack, heart failure, a stroke, kidney disease, and other problems. A blood pressure reading consists of a higher number over a lower number. Ideally, your blood pressure should be below 120/80. The first (top) number is called the systolic pressure. It is a measure of the pressure in your arteries as your heart beats. The second (bottom) number is called the diastolic pressure. It is a measure of the pressure in your arteries as  the heart  relaxes. What are the causes? The exact cause of this condition is not known. There are some conditions that result in high blood pressure. What increases the risk? Certain factors may make you more likely to develop high blood pressure. Some of these risk factors are under your control, including: Smoking. Not getting enough exercise or physical activity. Being overweight. Having too much fat, sugar, calories, or salt (sodium) in your diet. Drinking too much alcohol. Other risk factors include: Having a personal history of heart disease, diabetes, high cholesterol, or kidney disease. Stress. Having a family history of high blood pressure and high cholesterol. Having obstructive sleep apnea. Age. The risk increases with age. What are the signs or symptoms? High blood pressure may not cause symptoms. Very high blood pressure (hypertensive crisis) may cause: Headache. Fast or irregular heartbeats (palpitations). Shortness of breath. Nosebleed. Nausea and vomiting. Vision changes. Severe chest pain, dizziness, and seizures. How is this diagnosed? This condition is diagnosed by measuring your blood pressure while you are seated, with your arm resting on a flat surface, your legs uncrossed, and your feet flat on the floor. The cuff of the blood pressure monitor will be placed directly against the skin of your upper arm at the level of your heart. Blood pressure should be measured at least twice using the same arm. Certain conditions can cause a difference in blood pressure between your right and left arms. If you have a high blood pressure reading during one visit or you have normal blood pressure with other risk factors, you may be asked to: Return on a different day to have your blood pressure checked again. Monitor your blood pressure at home for 1 week or longer. If you are diagnosed with hypertension, you may have other blood or imaging tests to help your health care provider understand  your overall risk for other conditions. How is this treated? This condition is treated by making healthy lifestyle changes, such as eating healthy foods, exercising more, and reducing your alcohol intake. You may be referred for counseling on a healthy diet and physical activity. Your health care provider may prescribe medicine if lifestyle changes are not enough to get your blood pressure under control and if: Your systolic blood pressure is above 130. Your diastolic blood pressure is above 80. Your personal target blood pressure may vary depending on your medical conditions, your age, and other factors. Follow these instructions at home: Eating and drinking  Eat a diet that is high in fiber and potassium, and low in sodium, added sugar, and fat. An example of this eating plan is called the DASH diet. DASH stands for Dietary Approaches to Stop Hypertension. To eat this way: Eat plenty of fresh fruits and vegetables. Try to fill one half of your plate at each meal with fruits and vegetables. Eat whole grains, such as whole-wheat pasta, brown rice, or whole-grain bread. Fill about one fourth of your plate with whole grains. Eat or drink low-fat dairy products, such as skim milk or low-fat yogurt. Avoid fatty cuts of meat, processed or cured meats, and poultry with skin. Fill about one fourth of your plate with lean proteins, such as fish, chicken without skin, beans, eggs, or tofu. Avoid pre-made and processed foods. These tend to be higher in sodium, added sugar, and fat. Reduce your daily sodium intake. Many people with hypertension should eat less than 1,500 mg of sodium a day. Do not drink alcohol if: Your health care provider tells you  not to drink. You are pregnant, may be pregnant, or are planning to become pregnant. If you drink alcohol: Limit how much you have to: 0-1 drink a day for women. 0-2 drinks a day for men. Know how much alcohol is in your drink. In the [place] one drink equals  one 12 oz bottle of beer (355 mL), one 5 oz glass of wine (148 mL), or one 1 oz glass of hard liquor (44 mL). Lifestyle  Work with your health care provider to maintain a healthy body weight or to lose weight. Ask what an ideal weight is for you. Get at least 30 minutes of exercise that causes your heart to beat faster (aerobic exercise) most days of the week. Activities may include walking, swimming, or biking. Include exercise to strengthen your muscles (resistance exercise), such as Pilates or lifting weights, as part of your weekly exercise routine. Try to do these types of exercises for 30 minutes at least 3 days a week. Do not use any products that contain nicotine or tobacco. These products include cigarettes, chewing tobacco, and vaping devices, such as e-cigarettes. If you need help quitting, ask your health care provider. Monitor your blood pressure at home as told by your health care provider. Keep all follow-up visits. This is important. Medicines Take over-the-counter and prescription medicines only as told by your health care provider. Follow directions carefully. Blood pressure medicines must be taken as prescribed. Do not skip doses of blood pressure medicine. Doing this puts you at risk for problems and can make the medicine less effective. Ask your health care provider about side effects or reactions to medicines that you should watch for. Contact a health care provider if you: Think you are having a reaction to a medicine you are taking. Have headaches that keep coming back (recurring). Feel dizzy. Have swelling in your ankles. Have trouble with your vision. Get help right away if you: Develop a severe headache or confusion. Have unusual weakness or numbness. Feel faint. Have severe pain in your chest or abdomen. Vomit repeatedly. Have trouble breathing. These symptoms may be an emergency. Get help right away. Call 911. Do not wait to see if the symptoms will go  away. Do not drive yourself to the hospital. Summary Hypertension is when the force of blood pumping through your arteries is too strong. If this condition is not controlled, it may put you at risk for serious complications. Your personal target blood pressure may vary depending on your medical conditions, your age, and other factors. For most people, a normal blood pressure is less than 120/80. Hypertension is treated with lifestyle changes, medicines, or a combination of both. Lifestyle changes include losing weight, eating a healthy, low-sodium diet, exercising more, and limiting alcohol. This information is not intended to replace advice given to you by your health care provider. Make sure you discuss any questions you have with your health care provider. Document Revised: 12/09/2020 Document Reviewed: 12/09/2020 Elsevier Patient Education  2024 [employer]     Emil Schaumann, MD [facility]

## 2023-10-28 ENCOUNTER — Ambulatory Visit: Payer: Self-pay | Admitting: Emergency Medicine
